# Patient Record
Sex: Female | Born: 1945 | Race: White | Hispanic: No | Marital: Married | Smoking: Never smoker
Health system: Southern US, Community
[De-identification: ages and names within clinical notes are randomized; demographics above are authoritative.]

## PROBLEM LIST (undated history)

## (undated) DIAGNOSIS — F419 Anxiety disorder, unspecified: Secondary | ICD-10-CM

## (undated) DIAGNOSIS — F32A Depression, unspecified: Secondary | ICD-10-CM

## (undated) DIAGNOSIS — K219 Gastro-esophageal reflux disease without esophagitis: Secondary | ICD-10-CM

## (undated) DIAGNOSIS — F329 Major depressive disorder, single episode, unspecified: Secondary | ICD-10-CM

## (undated) DIAGNOSIS — I10 Essential (primary) hypertension: Secondary | ICD-10-CM

## (undated) DIAGNOSIS — E785 Hyperlipidemia, unspecified: Secondary | ICD-10-CM

## (undated) HISTORY — DX: Major depressive disorder, single episode, unspecified: F32.9

## (undated) HISTORY — DX: Gastro-esophageal reflux disease without esophagitis: K21.9

## (undated) HISTORY — PX: OTHER SURGICAL HISTORY: SHX169

## (undated) HISTORY — DX: Essential (primary) hypertension: I10

## (undated) HISTORY — DX: Hyperlipidemia, unspecified: E78.5

## (undated) HISTORY — DX: Depression, unspecified: F32.A

## (undated) HISTORY — DX: Anxiety disorder, unspecified: F41.9

---

## 1998-07-27 ENCOUNTER — Other Ambulatory Visit: Admission: RE | Admit: 1998-07-27 | Discharge: 1998-07-27 | Payer: Self-pay | Admitting: Internal Medicine

## 1999-01-13 HISTORY — PX: COLONOSCOPY: SHX174

## 1999-02-04 ENCOUNTER — Encounter: Payer: Self-pay | Admitting: Internal Medicine

## 2002-02-12 ENCOUNTER — Other Ambulatory Visit: Admission: RE | Admit: 2002-02-12 | Discharge: 2002-02-12 | Payer: Self-pay | Admitting: Internal Medicine

## 2004-09-20 ENCOUNTER — Ambulatory Visit: Payer: Self-pay | Admitting: Internal Medicine

## 2005-01-17 ENCOUNTER — Ambulatory Visit: Payer: Self-pay | Admitting: Internal Medicine

## 2005-07-04 ENCOUNTER — Ambulatory Visit: Payer: Self-pay | Admitting: Internal Medicine

## 2005-07-07 ENCOUNTER — Ambulatory Visit: Payer: Self-pay | Admitting: Internal Medicine

## 2005-07-13 ENCOUNTER — Ambulatory Visit: Payer: Self-pay | Admitting: Internal Medicine

## 2005-07-13 ENCOUNTER — Other Ambulatory Visit: Admission: RE | Admit: 2005-07-13 | Discharge: 2005-07-13 | Payer: Self-pay | Admitting: Internal Medicine

## 2005-07-13 ENCOUNTER — Encounter: Payer: Self-pay | Admitting: Internal Medicine

## 2006-04-24 ENCOUNTER — Ambulatory Visit: Payer: Self-pay | Admitting: Internal Medicine

## 2007-08-29 DIAGNOSIS — E785 Hyperlipidemia, unspecified: Secondary | ICD-10-CM | POA: Insufficient documentation

## 2008-08-19 ENCOUNTER — Ambulatory Visit: Payer: Self-pay | Admitting: Internal Medicine

## 2008-08-19 DIAGNOSIS — I10 Essential (primary) hypertension: Secondary | ICD-10-CM | POA: Insufficient documentation

## 2008-08-19 DIAGNOSIS — R21 Rash and other nonspecific skin eruption: Secondary | ICD-10-CM | POA: Insufficient documentation

## 2008-08-19 HISTORY — DX: Essential (primary) hypertension: I10

## 2008-09-09 ENCOUNTER — Encounter: Payer: Self-pay | Admitting: Internal Medicine

## 2008-11-05 ENCOUNTER — Telehealth: Payer: Self-pay | Admitting: *Deleted

## 2008-12-25 ENCOUNTER — Ambulatory Visit: Payer: Self-pay | Admitting: Internal Medicine

## 2008-12-30 ENCOUNTER — Ambulatory Visit: Payer: Self-pay | Admitting: Internal Medicine

## 2008-12-30 LAB — HM COLONOSCOPY

## 2009-10-02 ENCOUNTER — Ambulatory Visit: Payer: Self-pay | Admitting: Internal Medicine

## 2009-10-02 ENCOUNTER — Telehealth: Payer: Self-pay | Admitting: Internal Medicine

## 2009-10-02 DIAGNOSIS — R1084 Generalized abdominal pain: Secondary | ICD-10-CM | POA: Insufficient documentation

## 2009-10-02 DIAGNOSIS — R110 Nausea: Secondary | ICD-10-CM | POA: Insufficient documentation

## 2009-10-02 DIAGNOSIS — R197 Diarrhea, unspecified: Secondary | ICD-10-CM | POA: Insufficient documentation

## 2009-10-02 DIAGNOSIS — R5383 Other fatigue: Secondary | ICD-10-CM

## 2009-10-02 DIAGNOSIS — R5381 Other malaise: Secondary | ICD-10-CM | POA: Insufficient documentation

## 2009-10-02 DIAGNOSIS — R112 Nausea with vomiting, unspecified: Secondary | ICD-10-CM

## 2009-10-02 HISTORY — DX: Nausea with vomiting, unspecified: R11.2

## 2009-10-02 HISTORY — DX: Generalized abdominal pain: R10.84

## 2009-10-02 LAB — CONVERTED CEMR LAB
AST: 17 units/L (ref 0–37)
Albumin: 3.8 g/dL (ref 3.5–5.2)
BUN: 8 mg/dL (ref 6–23)
Basophils Relative: 0.5 % (ref 0.0–3.0)
Calcium: 9.6 mg/dL (ref 8.4–10.5)
Chloride: 101 meq/L (ref 96–112)
Creatinine, Ser: 0.6 mg/dL (ref 0.4–1.2)
Eosinophils Absolute: 0.3 10*3/uL (ref 0.0–0.7)
Eosinophils Relative: 2.4 % (ref 0.0–5.0)
GFR calc non Af Amer: 107.76 mL/min (ref >60)
Glucose, Bld: 109 mg/dL — ABNORMAL HIGH (ref 70–99)
Hemoglobin: 12.7 g/dL (ref 12.0–15.0)
Lymphocytes Relative: 13.3 % (ref 12.0–46.0)
MCHC: 34.5 g/dL (ref 30.0–36.0)
Monocytes Relative: 7 % (ref 3.0–12.0)
Neutro Abs: 8.5 10*3/uL — ABNORMAL HIGH (ref 1.4–7.7)
Neutrophils Relative %: 76.8 % (ref 43.0–77.0)
Potassium: 4.6 meq/L (ref 3.5–5.1)
RBC: 3.67 M/uL — ABNORMAL LOW (ref 3.87–5.11)
WBC: 11.2 10*3/uL — ABNORMAL HIGH (ref 4.5–10.5)

## 2009-10-05 ENCOUNTER — Telehealth (INDEPENDENT_AMBULATORY_CARE_PROVIDER_SITE_OTHER): Payer: Self-pay | Admitting: *Deleted

## 2009-10-16 ENCOUNTER — Ambulatory Visit: Payer: Self-pay | Admitting: Internal Medicine

## 2009-10-16 DIAGNOSIS — R0982 Postnasal drip: Secondary | ICD-10-CM | POA: Insufficient documentation

## 2009-10-16 DIAGNOSIS — M949 Disorder of cartilage, unspecified: Secondary | ICD-10-CM

## 2009-10-16 DIAGNOSIS — M899 Disorder of bone, unspecified: Secondary | ICD-10-CM | POA: Insufficient documentation

## 2009-10-16 HISTORY — DX: Postnasal drip: R09.82

## 2010-07-16 ENCOUNTER — Ambulatory Visit: Payer: Self-pay | Admitting: Internal Medicine

## 2010-07-16 DIAGNOSIS — R6882 Decreased libido: Secondary | ICD-10-CM | POA: Insufficient documentation

## 2010-07-27 ENCOUNTER — Telehealth: Payer: Self-pay | Admitting: *Deleted

## 2010-07-29 ENCOUNTER — Encounter: Payer: Self-pay | Admitting: Internal Medicine

## 2010-07-29 ENCOUNTER — Telehealth: Payer: Self-pay | Admitting: Internal Medicine

## 2010-08-03 ENCOUNTER — Ambulatory Visit: Payer: Self-pay | Admitting: Internal Medicine

## 2010-08-03 LAB — CONVERTED CEMR LAB
Alkaline Phosphatase: 56 units/L (ref 39–117)
BUN: 14 mg/dL (ref 6–23)
Bilirubin Urine: NEGATIVE
Bilirubin, Direct: 0.1 mg/dL (ref 0.0–0.3)
Blood in Urine, dipstick: NEGATIVE
CO2: 30 meq/L (ref 19–32)
Calcium: 10.4 mg/dL (ref 8.4–10.5)
Chloride: 103 meq/L (ref 96–112)
Cholesterol: 263 mg/dL — ABNORMAL HIGH (ref 0–200)
Creatinine, Ser: 0.8 mg/dL (ref 0.4–1.2)
Direct LDL: 131.2 mg/dL
Eosinophils Absolute: 0.3 10*3/uL (ref 0.0–0.7)
Free T4: 0.77 ng/dL (ref 0.60–1.60)
Glucose, Urine, Semiquant: NEGATIVE
Ketones, urine, test strip: NEGATIVE
Lymphocytes Relative: 40.9 % (ref 12.0–46.0)
MCHC: 34.2 g/dL (ref 30.0–36.0)
MCV: 100.3 fL — ABNORMAL HIGH (ref 78.0–100.0)
Monocytes Absolute: 0.5 10*3/uL (ref 0.1–1.0)
Neutrophils Relative %: 44 % (ref 43.0–77.0)
Platelets: 247 10*3/uL (ref 150.0–400.0)
Protein, U semiquant: NEGATIVE
RBC: 3.84 M/uL — ABNORMAL LOW (ref 3.87–5.11)
T3, Free: 2.6 pg/mL (ref 2.3–4.2)
Total Bilirubin: 0.5 mg/dL (ref 0.3–1.2)
Total CHOL/HDL Ratio: 3
Triglycerides: 197 mg/dL — ABNORMAL HIGH (ref 0.0–149.0)
Urobilinogen, UA: 0.2
VLDL: 39.4 mg/dL (ref 0.0–40.0)
WBC: 5 10*3/uL (ref 4.5–10.5)
pH: 7

## 2010-08-10 ENCOUNTER — Ambulatory Visit: Payer: Self-pay | Admitting: Internal Medicine

## 2010-08-10 ENCOUNTER — Other Ambulatory Visit: Admission: RE | Admit: 2010-08-10 | Discharge: 2010-08-10 | Payer: Self-pay | Admitting: Internal Medicine

## 2010-08-10 DIAGNOSIS — R718 Other abnormality of red blood cells: Secondary | ICD-10-CM | POA: Insufficient documentation

## 2010-08-10 DIAGNOSIS — R350 Frequency of micturition: Secondary | ICD-10-CM

## 2010-08-10 DIAGNOSIS — R9431 Abnormal electrocardiogram [ECG] [EKG]: Secondary | ICD-10-CM | POA: Insufficient documentation

## 2010-08-10 DIAGNOSIS — E875 Hyperkalemia: Secondary | ICD-10-CM | POA: Insufficient documentation

## 2010-08-10 HISTORY — DX: Frequency of micturition: R35.0

## 2010-08-10 HISTORY — DX: Hyperkalemia: E87.5

## 2010-08-10 HISTORY — DX: Abnormal electrocardiogram (ECG) (EKG): R94.31

## 2010-08-10 LAB — CONVERTED CEMR LAB
Bilirubin Urine: NEGATIVE
Glucose, Urine, Semiquant: NEGATIVE
Ketones, urine, test strip: NEGATIVE
Specific Gravity, Urine: 1.01
pH: 7.5

## 2010-08-16 ENCOUNTER — Encounter: Payer: Self-pay | Admitting: Internal Medicine

## 2010-08-16 LAB — CONVERTED CEMR LAB
Calcium: 10.3 mg/dL (ref 8.4–10.5)
Folate: >20 ng/mL
GFR calc non Af Amer: 56.34 mL/min (ref >60)
Glucose, Bld: 90 mg/dL (ref 70–99)
Sodium: 143 meq/L (ref 135–145)
Vitamin B-12: 791 pg/mL (ref 211–911)

## 2010-08-23 LAB — CONVERTED CEMR LAB: Pap Smear: NEGATIVE

## 2010-08-26 ENCOUNTER — Ambulatory Visit: Payer: Self-pay

## 2010-08-26 ENCOUNTER — Encounter: Payer: Self-pay | Admitting: Internal Medicine

## 2010-08-26 ENCOUNTER — Ambulatory Visit (HOSPITAL_COMMUNITY): Admission: RE | Admit: 2010-08-26 | Discharge: 2010-08-26 | Payer: Self-pay | Admitting: Internal Medicine

## 2010-08-26 ENCOUNTER — Ambulatory Visit: Payer: Self-pay | Admitting: Cardiology

## 2010-09-08 ENCOUNTER — Telehealth: Payer: Self-pay | Admitting: *Deleted

## 2010-09-22 ENCOUNTER — Encounter: Payer: Self-pay | Admitting: Internal Medicine

## 2010-12-14 NOTE — Assessment & Plan Note (Signed)
Summary: CPX--PAP//OK PER DR //CCM   Vital Signs:  Patient profile:   65 year old female Menstrual status:  postmenopausal Height:      64.5 inches Weight:      114 pounds BMI:     19.34 Pulse rate:   72 / minute BP sitting:   100 / 66  (right arm) Cuff size:   regular  Vitals Entered By: Romualdo Bolk, CMA (AAMA) (August 10, 2010 1:50 PM)  Serial Vital Signs/Assessments:  Time      Position  BP       Pulse  Resp  Temp     By                     118/80                         Madelin Headings MD  Comments: left arm sitting    wrist cuff 124  By: Madelin Headings MD   CC: CPX with pap   History of Present Illness: Sheri Jensen comes in today  for  preventive visit  and pap  Since last visit  here  there have been no major changes in health status  however she does have  Some increase urgency   for days.  No fever or dysuria  sleep  some better but has intreruptions ? why Has been taking wellbutrin   every day for this past month.   about 10 am.  BP readings are up and down and has brought her machine today.  NO se of meds noted  See last OV .  Preventive Care Screening  Mammogram:    Date:  07/29/2010    Results:  normal   Prior Values:    Mammogram:  normal (06/14/2005)    Colonoscopy:  Location:  [facility].   (12/30/2008)    Bone Density:  done (05/14/2002)    Dexa Interp:  done (05/14/2002)   Preventive Screening-Counseling & Management  Alcohol-Tobacco     Alcohol drinks/day: 2     Alcohol Counseling: to decrease amount and/or frequency of alcohol intake     Smoking Status: never  Caffeine-Diet-Exercise     Caffeine use/day: 0     Does Patient Exercise: yes  Comments: 17  servings per  week of etoh .   Hep-HIV-STD-Contraception     Dental Visit-last 6 months yes     Sun Exposure-Excessive: no  Safety-Violence-Falls     Seat Belt Use: yes     Firearms in the Home: firearms in the home     Firearm Counseling: not  indicated; uses recommended firearm safety measures     Smoke Detectors: yes     Violence in the Home: no risk noted     Sexual Abuse: no     Fall Risk: no       Blood Transfusions:  no.    Current Medications (verified): 1)  Wellbutrin Sr 100 Mg Tb12 (Bupropion Hcl) .... Take One By Mouth Every Day 2)  Alendronate Sodium 70 Mg Tabs (Alendronate Sodium) .Marland Kitchen.. 1 By Mouth Weekly 3)  Calcium-Vitamin D 250-125 Mg-Unit Tabs (Calcium Carbonate-Vitamin D) 4)  Antihistamine Allergy 25 Mg Caps (Diphenhydramine Hcl) .... Take Two Tabs By Mouth Every 4-6 Hours 5)  Womens One Daily  Tabs (Multiple Vitamins-Minerals) .... Once Daily 6)  Lisinopril 10 Mg Tabs (Lisinopril) .Marland Kitchen.. 1 By Mouth Once Daily For High Blood Pressure  Allergies (  verified): No Known Drug Allergies  Past History:  Past medical, surgical, family and social histories (including risk factors) reviewed, and no changes noted (except as noted below).  Past Medical History: Hyperlipidemia Depression Childbirth hypertension  Past Surgical History: Reviewed history from 10/02/2009 and no changes required. CB X2 colonoscopy  03/00   ,2/10-NORMAL  Past History:  Care Management: Gastroenterology:[facility]ie  Family History: Reviewed history from 10/16/2009 and no changes required. Family History of Colon CA 1st degree relative <60--maternal grandfather daughter with crohns disease  in remission Family History of Diabetes: paternal grandfather   Social History: Reviewed history from 10/16/2009 and no changes required. no tobacco   hh of 2  etoh 3 per day Married one son and one daughter exercise Occupation: former Programmer, systems Illicit Drug Use - no   Risk analyst Use:  yes Dental Care w/in 6 mos.:  yes Sun Exposure-Excessive:  no Fall Risk:  no  Blood Transfusions:  no  Review of Systems  The patient denies anorexia, fever, weight loss, chest pain, syncope, dyspnea on exertion, peripheral edema, prolonged cough, difficulty  walking, abnormal bleeding, enlarged lymph nodes, and angioedema.         neg cv pulmonary , Gi effects currently . no change skin  vision or hearing.   Physical Exam  General:  Well-developed,well-nourished,in no acute distress; alert,appropriate and cooperative throughout examination Head:  normocephalic and atraumatic.   Eyes:  PERRL, EOMs full, conjunctiva clear  Ears:  R ear normal, L ear normal, and no external deformities.   Nose:  no external deformity, no external erythema, and no nasal discharge.   Mouth:  good dentition and pharynx pink and moist.   Neck:  No deformities, masses, or tenderness noted. Chest Wall:  No deformities, masses, or tenderness noted. Breasts:  No mass, nodules, thickening, tenderness, bulging, retraction, inflamation, nipple discharge or skin changes noted.   Lungs:  Normal respiratory effort, chest expands symmetrically. Lungs are clear to auscultation, no crackles or wheezes. Heart:  Normal rate and regular rhythm. S1 and S2 normal without gallop, murmur, click, rub or other extra sounds.no lifts.    see bp readings Abdomen:  Bowel sounds positive,abdomen soft and non-tender without masses, organomegaly or hernias noted. Rectal:  No external abnormalities noted. Normal sphincter tone. No rectal masses or tenderness. Genitalia:  Pelvic Exam:        External: normal female genitalia without lesions or masses        Vagina: normal without lesions or masses        Cervix: normal  masses tinu poly tissue at os        Adnexa: normal bimanual exam without masses or fullness        Uterus: normal by palpation        Pap smear: performed Msk:  no joint swelling, no joint warmth, and no redness over joints.   Pulses:  R and L carotid,radial,femoral,dorsalis pedis and posterior tibial pulses are full and equal bilaterally Extremities:  no clubbing cyanosis or edema  Neurologic:  alert & oriented X3, cranial nerves II-XII intact, strength normal in all  extremities, and gait normal.   Skin:  turgor normal, color normal, no ecchymoses, no petechiae, and no purpura.   Cervical Nodes:  No lymphadenopathy noted Axillary Nodes:  No palpable lymphadenopathy Inguinal Nodes:  No significant adenopathy Psych:  Oriented X3, normally interactive, good eye contact, not anxious appearing, and not depressed appearing.     Impression & Recommendations:  Problem # 1:  PREVENTIVE HEALTH CARE (ICD-V70.0) disc healthy lifestyle  Orders: EKG w/ Interpretation (93000)  Problem # 2:  ROUTINE GYNECOLOGICAL EXAM (ICD-V72.31)  pap done   Orders: Pap Smear, Thin Prep ( Collection of) (B3532)  Problem # 3:  HYPERPOTASSEMIA (ICD-276.7) poss venipuncture effect. Orders: TLB-BMP (Basic Metabolic Panel-BMET) (80048-METABOL) Venipuncture (99242) Specimen Handling (68341)  Problem # 4:  OTHER ABNORMALITY OF RED BLOOD CELLS (ICD-790.09) elevated mcv  daily alcohol ?  if reltated  check fot b12  etc  no evidence  other blood diseaeses.  Orders: TLB-B12 + Folate Pnl (96222_97989-Q11/HER) Venipuncture (74081) Specimen Handling (44818)  Problem # 5:  LIBIDO, DECREASED (ICD-799.81) is menopausal but denies  dyspaerunia as cause    . unsure how mood is related .   disc daily alcohol intake but that is not  any change  .    Problem # 6:  OSTEOPENIA (ICD-733.90)  The following medications were removed from the medication list:    Alendronate Sodium 70 Mg Tabs (Alendronate sodium) .Marland Kitchen... 1 by mouth weekly Her updated medication list for this problem includes:    Calcium-vitamin D 250-125 Mg-unit Tabs (Calcium carbonate-vitamin d)  Problem # 7:  HYPERTENSION (ICD-401.9) bp  good today and repeat her own cuff  which is a wrist cuff  discordant at first but  better when  done at heart level.  personally instructed on this by MD today.   Her updated medication list for this problem includes:    Lisinopril 10 Mg Tabs (Lisinopril) .Marland Kitchen... 1 by mouth once daily for  high blood pressure  Problem # 8:  ekg  show prominent p waves but probably wnl  no old ekg to compare.  in EHR  Complete Medication List: 1)  Wellbutrin Sr 100 Mg Tb12 (Bupropion hcl) .... Take one by mouth every day 2)  Calcium-vitamin D 250-125 Mg-unit Tabs (Calcium carbonate-vitamin d) 3)  Antihistamine Allergy 25 Mg Caps (diphenhydramine Hcl)  .... Take two tabs by mouth every 4-6 hours 4)  Womens One Daily Tabs (Multiple vitamins-minerals) .... Once daily 5)  Lisinopril 10 Mg Tabs (Lisinopril) .Marland Kitchen.. 1 by mouth once daily for high blood pressure  Other Orders: UA Dipstick w/o Micro (automated)  (81003) Tdap => 6yrs IM (56314) Admin 1st Vaccine (97026)  Patient Instructions: 1)  take wellbutrin in the am  to not affect sleep.  2)  You will be informed of lab results when available. on repeat potassium  and b12 levels then plan follow up . 3)  would like to increase wellbutrin to  two times a day eventually. 4)  will review your ekg   consider echo test.  5)  You should do a trial off of alcohol for 2 -3 weeks and see  how you feel.   Contraindications/Deferment of Procedures/Staging:    Test/Procedure: FLU VAX    Reason for deferment: patient declined   Laboratory Results   Urine Tests    Routine Urinalysis   Color: yellow Appearance: Clear Glucose: negative   (Normal Range: Negative) Bilirubin: negative   (Normal Range: Negative) Ketone: negative   (Normal Range: Negative) Spec. Gravity: 1.010   (Normal Range: 1.003-1.035) Blood: negative   (Normal Range: Negative) pH: 7.5   (Normal Range: 5.0-8.0) Protein: negative   (Normal Range: Negative) Urobilinogen: 0.2   (Normal Range: 0-1) Nitrite: negative   (Normal Range: Negative) Leukocyte Esterace: negative   (Normal Range: Negative)    Comments: Rita Ohara  August 10, 2010 4:44 PM  Immunizations Administered:  Tetanus Vaccine:    Vaccine Type: Tdap    Site: right deltoid    Mfr:  GlaxoSmithKline    Dose: 0.5 ml    Route: IM    Given by: Romualdo Bolk, CMA (AAMA)    Exp. Date: 09/02/2012    Lot #: [id number]

## 2010-12-14 NOTE — Assessment & Plan Note (Signed)
Summary: MED CK / REFILL // RS   Vital Signs:  Patient profile:   65 year old female Menstrual status:  postmenopausal Height:      65 inches Weight:      114 pounds BMI:     19.04 Pulse rate:   72 / minute BP sitting:   120 / 80  (right arm) Cuff size:   regular  Vitals Entered By: Romualdo Bolk, CMA (AAMA) (July 16, 2010 2:03 PM)  Contraindications/Deferment of Procedures/Staging:    Test/Procedure: FLU VAX    Reason for deferment: patient declined  CC: Follow-up visit  on meds, Hypertension Management     Menstrual Status postmenopausal   History of Present Illness: Sheri Jensen comes in today  for  meds . check and  evaluation her last viist was  in December .  No major changes in health  BP :  Taking med without se  .Marland KitchenMarland KitchenHas  had lifestyle intervention delete salt  in diet .   and bp 120 range.  Mood  evey other daywell butrin.  and missed for 5 days and fatigue has not come back What to do with med  COncern decrease interest in SA   not related to pain.   interfereing with marital relationship .   anger at times  denies  physical pain. prefers no conseling as toakes too long. ? other options Osteopenia.  doesnt like fosamax  to disrupting to take and worry about eophageal problem. Stopped for about a year   Hypertension History:      She denies headache, chest pain, palpitations, dyspnea with exertion, orthopnea, PND, peripheral edema, visual symptoms, neurologic problems, syncope, and side effects from treatment.  She notes no problems with any antihypertensive medication side effects.        Positive major cardiovascular risk factors include female age 50 years old or older, hyperlipidemia, and hypertension.  Negative major cardiovascular risk factors include non-tobacco-user status.     Preventive Screening-Counseling & Management  Alcohol-Tobacco     Alcohol drinks/day: 2     Smoking Status: never  Caffeine-Diet-Exercise     Caffeine use/day: 0  Does Patient Exercise: yes  Current Medications (verified): 1)  Wellbutrin Sr 100 Mg Tb12 (Bupropion Hcl) .... Take One By Mouth Every Other Day 2)  Alendronate Sodium 70 Mg Tabs (Alendronate Sodium) .Marland Kitchen.. 1 By Mouth Weekly 3)  Magnebind 200 450-200 Mg Tabs (Calcium Acetate-Magnesium Carb) .... Take Two Tabs By Mouth Once Daily 4)  Antihistamine Allergy 25 Mg Caps (Diphenhydramine Hcl) .... Take Two Tabs By Mouth Every 4-6 Hours 5)  Nasal Decongestant 10 Mg Tabs (Phenylephrine Hcl) .... Take One By Mouth Every 4 Hours As Needed 6)  Womens One Daily  Tabs (Multiple Vitamins-Minerals) .... Once Daily 7)  Lisinopril 10 Mg Tabs (Lisinopril) .Marland Kitchen.. 1 By Mouth Once Daily For High Blood Pressure  Allergies (verified): No Known Drug Allergies  Past History:  Past medical, surgical, family and social histories (including risk factors) reviewed, and no changes noted (except as noted below).  Past Medical History: Reviewed history from 10/02/2009 and no changes required. Hyperlipidemia Depression  Past Surgical History: Reviewed history from 10/02/2009 and no changes required. CB X2 colonoscopy  03/00   ,2/10-NORMAL  Past History:  Care Management: Gastroenterology:[facility]ie  Family History: Reviewed history from 10/16/2009 and no changes required. Family History of Colon CA 1st degree relative <60--maternal grandfather daughter with crohns disease  in remission Family History of Diabetes: paternal grandfather   Social  History: Reviewed history from 10/16/2009 and no changes required. no tobacco  etoh 3 per day Married one son and one daughter exercise Occupation: former Programmer, systems Illicit Drug Use - no   Caffeine use/day:  0  Review of Systems  The patient denies anorexia, fever, weight loss, weight gain, chest pain, syncope, melena, hematochezia, severe indigestion/heartburn, transient blindness, difficulty walking, abnormal bleeding, enlarged lymph nodes, and angioedema.     Physical Exam  General:  Well-developed,well-nourished,in no acute distress; alert,appropriate and cooperative throughout examinationwell-developed.   Head:  normocephalic and atraumatic.   Eyes:  vision grossly intact.   Neck:  No deformities, masses, or tenderness noted. Lungs:  Normal respiratory effort, chest expands symmetrically. Lungs are clear to auscultation, no crackles or wheezes. Heart:  Normal rate and regular rhythm. S1 and S2 normal without gallop, murmur, click, rub or other extra sounds.no lifts.   Abdomen:  Bowel sounds positive,abdomen soft and non-tender without masses, organomegaly or   noted. Pulses:  nl cap refill Extremities:  no clubbing cyanosis or edema  Neurologic:  grossly nl non focal  Skin:  turgor normal and color normal.   Cervical Nodes:  No lymphadenopathy noted Psych:  Oriented X3, not anxious appearing, and not depressed appearing.  talkative      Impression & Recommendations:  Problem # 1:  HYPERTENSION (ICD-401.9)  Her updated medication list for this problem includes:    Lisinopril 10 Mg Tabs (Lisinopril) .Marland Kitchen... 1 by mouth once daily for high blood pressure  Problem # 2:  LIBIDO, DECREASED (ICD-799.81) unrevealing  by hx   vs mismatch and other factors .    need check up   and labs   and then decide on further eval   Problem # 3:  OSTEOPENIA (ICD-733.90) stopped med  ? se   will revisit in future . Her updated medication list for this problem includes:    Alendronate Sodium 70 Mg Tabs (Alendronate sodium) .Marland Kitchen... 1 by mouth weekly    Magnebind 200 450-200 Mg Tabs (Calcium acetate-magnesium carb) .Marland Kitchen... Take two tabs by mouth once daily  Problem # 4:  Preventive Health Care (ICD-V70.0) over due on mammo and pap pelvic   convinced patient  important to do this and then   Complete Medication List: 1)  Wellbutrin Sr 100 Mg Tb12 (Bupropion hcl) .... Take one by mouth every day 2)  Alendronate Sodium 70 Mg Tabs (Alendronate sodium) .Marland Kitchen.. 1 by  mouth weekly 3)  Magnebind 200 450-200 Mg Tabs (Calcium acetate-magnesium carb) .... Take two tabs by mouth once daily 4)  Antihistamine Allergy 25 Mg Caps (diphenhydramine Hcl)  .... Take two tabs by mouth every 4-6 hours 5)  Womens One Daily Tabs (Multiple vitamins-minerals) .... Once daily 6)  Lisinopril 10 Mg Tabs (Lisinopril) .Marland Kitchen.. 1 by mouth once daily for high blood pressure  Hypertension Assessment/Plan:      The patient's hypertensive risk group is category B: At least one risk factor (excluding diabetes) with no target organ damage.  Today's blood pressure is 120/80.  Her blood pressure goal is < 140/90.  Patient Instructions: 1)  increase aerobic exercise  2)  wellbutrin as we discussed.  every day  3)  need labs and a check up  before further recommendation.  4)  GET a  mammogram.  5)  continue   Lisinopril . 6)  Can revisit the fosamax issue  at a later time . 7)  Fasting lab   CPX with free T4 Free T3  8)  then CPX  ( can  make a slot    not on a thursday). 9)  Bring in your  Blood pressure machine to the   office visit.   Preventive Care Screening  Mammogram:    Date:  06/14/2005    Results:  normal   Prior Values:    Colonoscopy:  Location:  [facility].   (12/30/2008)    Bone Density:  done (05/14/2002)    Dexa Interp:  done (05/14/2002)

## 2010-12-14 NOTE — Progress Notes (Signed)
Summary: refill  Phone Note From Pharmacy   Caller: [facility] [phone]* Reason for Call: Needs renewal Details for Reason: bupropion Initial call taken by: Romualdo Bolk, CMA Duncan Dull),  July 27, 2010 4:45 PM  Follow-up for Phone Call        rx sent to pharmacy Follow-up by: Romualdo Bolk, CMA Duncan Dull),  July 27, 2010 4:46 PM    Prescriptions: WELLBUTRIN SR 100 MG TB12 (BUPROPION HCL) take one by mouth every day  #120 x 0   Entered by:   Romualdo Bolk, CMA (AAMA)   Authorized by:   Madelin Headings MD   Signed by:   Romualdo Bolk, CMA (AAMA) on 07/27/2010   Method used:   Electronically to        [id number] (retail)       [address] [place], [place]  [address]       Ph: [phone]       Fax: [phone]   RxID:   [id number]

## 2010-12-14 NOTE — Progress Notes (Signed)
Summary: results & bone density order  Phone Note Call from Patient Call back at Home Phone [phone]   Caller: vm Summary of Call: Echo results & I need bone density & was told that Dr. Demetrius Charity has to order bone density when I had my mammo done.   Initial call taken by: Rudy Jew, RN,  September 08, 2010 3:46 PM  Follow-up for Phone Call        Please order dexa scan  .    her echo test is normal and no further evalauation at this time. If she is still interested we can  do a  GYNE  referral  to Dr Hyacinth Meeker and Romine  to see if hormonal rx for decreased Libido would be helpful.   Follow-up by: Madelin Headings MD,  September 08, 2010 5:22 PM  Additional Follow-up for Phone Call Additional follow up Details #1::        LMTOCB Additional Follow-up by: Romualdo Bolk, CMA Duncan Dull),  September 08, 2010 5:24 PM    Additional Follow-up for Phone Call Additional follow up Details #2::    Pt aware and wants a rx mailed to her. Follow-up by: Romualdo Bolk, CMA Duncan Dull),  September 09, 2010 9:31 AM  Additional Follow-up for Phone Call Additional follow up Details #3:: Details for Additional Follow-up Action Taken: Mailed rx for dexa to pt. Additional Follow-up by: Romualdo Bolk, CMA Duncan Dull),  September 09, 2010 12:18 PM

## 2010-12-14 NOTE — Progress Notes (Signed)
Summary: Pt says that [facility] did not rcv script. Pls resend  Phone Note Call from Patient Call back at [facility] Phone [phone]   Caller: Patient Summary of Call: Pt called and was told by [facility], that they have not rcvd script for generic Wellbutrin. Pls resend to [facility].     Initial call taken by: Lucy Antigua,  July 29, 2010 1:28 PM    Prescriptions: WELLBUTRIN SR 100 MG TB12 (BUPROPION HCL) take one by mouth every day  #120 x 0   Entered by:   Lynann Beaver CMA   Authorized by:   Madelin Headings MD   Signed by:   Lynann Beaver CMA on 07/29/2010   Method used:   Electronically to        [id number] (retail)       [address] [place], [place]  [address]       Ph: [phone]       Fax: [phone]   RxID:   [id number]  done

## 2011-01-28 ENCOUNTER — Other Ambulatory Visit: Payer: Self-pay | Admitting: Internal Medicine

## 2011-03-29 NOTE — Assessment & Plan Note (Signed)
[facility]                                 ON-CALL NOTE   NAME:Averitt, KAY SHIPPY                       MRN:          [id number]  DATE:10/02/2009                            DOB:          [birth year]    We received Ms. Hopfer' call stating that she is having terrible gas  pains.  She had what she describes as food poising about 5 days ago.  She was seen by our PA, Amy Esterwood, today and was given Bentyl.  She  claims that the Bentyl was not helping the gas pains.  She is both  belching and passing large amounts of flatus.   I called in HyoMax 0.375 mg twice a day to be taken in lieu of Bentyl.     Barbette Hair. Arlyce Dice, MD,FACG  Electronically Signed    RDK/MedQ  DD: 10/02/2009  DT: 10/03/2009  Job #: [id number]   cc:   Hedwig Morton. Juanda Chance, MD

## 2011-05-28 ENCOUNTER — Other Ambulatory Visit: Payer: Self-pay | Admitting: Internal Medicine

## 2011-06-03 ENCOUNTER — Telehealth: Payer: Self-pay | Admitting: Internal Medicine

## 2011-06-03 DIAGNOSIS — R6882 Decreased libido: Secondary | ICD-10-CM

## 2011-06-03 NOTE — Telephone Encounter (Signed)
Pt. Was following up on a referral you had spoke to her about an OBGYN. Please contact

## 2011-06-03 NOTE — Telephone Encounter (Signed)
Spoke to pt and she was calling about the gyn referral from Sept. Order never got placed in emr. I sent the order to [facility]. Pt aware of this.

## 2011-09-06 ENCOUNTER — Encounter: Payer: Self-pay | Admitting: Internal Medicine

## 2011-10-18 ENCOUNTER — Telehealth: Payer: Self-pay | Admitting: *Deleted

## 2011-10-18 MED ORDER — BUPROPION HCL ER (SR) 100 MG PO TB12: 100.0000 mg | ORAL_TABLET | Freq: Every day | ORAL | Status: DC

## 2011-10-18 NOTE — Telephone Encounter (Signed)
Pt needs a refill on BRAND wellbutrin

## 2011-12-08 ENCOUNTER — Other Ambulatory Visit (INDEPENDENT_AMBULATORY_CARE_PROVIDER_SITE_OTHER): Payer: BC Managed Care – PPO

## 2011-12-08 DIAGNOSIS — Z0000 Encounter for general adult medical examination without abnormal findings: Secondary | ICD-10-CM

## 2011-12-08 LAB — CBC WITH DIFFERENTIAL/PLATELET
Basophils Absolute: 0 10*3/uL (ref 0.0–0.1)
Eosinophils Absolute: 0.4 10*3/uL (ref 0.0–0.7)
HCT: 37.3 % (ref 36.0–46.0)
Hemoglobin: 12.5 g/dL (ref 12.0–15.0)
Lymphs Abs: 1.5 10*3/uL (ref 0.7–4.0)
MCHC: 33.5 g/dL (ref 30.0–36.0)
Neutro Abs: 2.5 10*3/uL (ref 1.4–7.7)
RDW: 13.8 % (ref 11.5–14.6)

## 2011-12-08 LAB — POCT URINALYSIS DIPSTICK
Glucose, UA: NEGATIVE
Nitrite, UA: NEGATIVE
Urobilinogen, UA: 0.2

## 2011-12-08 LAB — HEPATIC FUNCTION PANEL
AST: 18 U/L (ref 0–37)
Albumin: 4.3 g/dL (ref 3.5–5.2)
Alkaline Phosphatase: 55 U/L (ref 39–117)
Total Bilirubin: 0.8 mg/dL (ref 0.3–1.2)

## 2011-12-08 LAB — TSH: TSH: 1.9 u[IU]/mL (ref 0.35–5.50)

## 2011-12-08 LAB — BASIC METABOLIC PANEL
CO2: 30 mEq/L (ref 19–32)
Glucose, Bld: 100 mg/dL — ABNORMAL HIGH (ref 70–99)
Potassium: 5.2 mEq/L — ABNORMAL HIGH (ref 3.5–5.1)
Sodium: 141 mEq/L (ref 135–145)

## 2011-12-08 LAB — LIPID PANEL
Total CHOL/HDL Ratio: 3
VLDL: 17.8 mg/dL (ref 0.0–40.0)

## 2011-12-08 LAB — LDL CHOLESTEROL, DIRECT: Direct LDL: 122.4 mg/dL

## 2011-12-27 ENCOUNTER — Encounter: Payer: Self-pay | Admitting: Internal Medicine

## 2012-01-16 ENCOUNTER — Telehealth: Payer: Self-pay | Admitting: Internal Medicine

## 2012-01-16 MED ORDER — BUPROPION HCL ER (SR) 100 MG PO TB12: 100.0000 mg | ORAL_TABLET | Freq: Every day | ORAL | Status: DC

## 2012-01-16 NOTE — Telephone Encounter (Signed)
Pt called and said that they need a new script for buPROPion [facility] SR) 100 MG 12 hr tablet to [facility] [phone]. Pt req that this needs to be called in today. Pt has been without meds for 6 days.

## 2012-01-16 NOTE — Telephone Encounter (Signed)
RX SENT TO PHARMACY

## 2012-01-24 ENCOUNTER — Encounter: Payer: Self-pay | Admitting: Internal Medicine

## 2012-01-24 ENCOUNTER — Ambulatory Visit (INDEPENDENT_AMBULATORY_CARE_PROVIDER_SITE_OTHER): Payer: BC Managed Care – PPO | Admitting: Internal Medicine

## 2012-01-24 VITALS — BP 98/60 | HR 92 | Ht 62.5 in | Wt 107.0 lb

## 2012-01-24 DIAGNOSIS — G478 Other sleep disorders: Secondary | ICD-10-CM

## 2012-01-24 DIAGNOSIS — I10 Essential (primary) hypertension: Secondary | ICD-10-CM

## 2012-01-24 DIAGNOSIS — K5900 Constipation, unspecified: Secondary | ICD-10-CM

## 2012-01-24 DIAGNOSIS — G479 Sleep disorder, unspecified: Secondary | ICD-10-CM

## 2012-01-24 DIAGNOSIS — M19049 Primary osteoarthritis, unspecified hand: Secondary | ICD-10-CM

## 2012-01-24 DIAGNOSIS — R718 Other abnormality of red blood cells: Secondary | ICD-10-CM

## 2012-01-24 DIAGNOSIS — Z0000 Encounter for general adult medical examination without abnormal findings: Secondary | ICD-10-CM

## 2012-01-24 HISTORY — DX: Sleep disorder, unspecified: G47.9

## 2012-01-24 HISTORY — DX: Primary osteoarthritis, unspecified hand: M19.049

## 2012-01-24 NOTE — Progress Notes (Signed)
Subjective:    Patient ID: Sheri Jensen, female    DOB: 01-30-1946, 66 y.o.   MRN: [id number]  HPI Patient comes in today for preventive visit and follow-up of medical issues. Update  history since  last visit:  Sleep  Always an issue  Usually wakening  2 etoh per night   Some tea at night avoids caffeine    Constipated. "All the time  " 2 x per week month and eating prunes . Considering  miralax  Prefers no chemical rx .  utd on colon 201o on 10 year recall   ?Mood : If try not taking the wellbutrin.  Numbness?  Left side and foot.  When shaving legs.  On phone and stiing  Lateral. Positional   No neck pain . Laying in bed sleep less noted it.   Onset for a month.  No whole extremity weakness or numbness  Exercise no problems   Weight training and aerobic and bands .   Has hand pain and swelling but no sig pain now  Has uncle with RA asks about this left more than right.   BP not checking no se of med  Review of Systems ROS:  GEN/ HEENT: No fever, significant weight changes sweats headaches vision problems hearing changes, CV/ PULM; No chest pain shortness of breath cough, syncope,edema  change in exercise tolerance. GI /GU: No adominal pain, vomiting, change in bowel habits. No blood in the stool. No significant GU symptoms. SKIN/HEME: ,no acute skin rashes suspicious lesions or bleeding. No lymphadenopathy, nodules, masses.  NEURO/ PSYCH:  No neurologic signs such as weakness numbness. No depression anxiety. IMM/ Allergy: No unusual infections.  Allergy .   REST of 12 system review negative except as per HPI Past history family history social history reviewed in the electronic medical record. Daughter had healthy baby 3 weeks ago.    Objective:   Physical Exam Physical Exam: Vital signs reviewed WUJ:WJXB is a well-developed well-nourished alert cooperative  white female who appears her stated age in no acute distress.  HEENT: normocephalic atraumatic , Eyes: PERRL  EOM's full, conjunctiva clear, Nares: paten,t no deformity discharge or tenderness., Ears: no deformity EAC's clear TMs with normal landmarks. Mouth: clear OP, no lesions, edema.  Moist mucous membranes. Dentition in adequate repair. NECK: supple without masses, thyromegaly or bruits. CHEST/PULM:  Clear to auscultation and percussion breath sounds equal no wheeze , rales or rhonchi. No chest wall deformities or tenderness. CV: PMI is nondisplaced, S1 S2 no gallops, murmurs, rubs. Peripheral pulses are full without delay.No JVD .  Breast: normal by inspection . No dimpling, discharge, masses, tenderness or discharge .  ABDOMEN: Bowel sounds normal nontender  No guard or rebound, no hepato splenomegal no CVA tenderness.  No hernia. Extremtities:  No clubbing cyanosis or edema, no acute joint swelling or redness no focal atrophy NEURO:  Oriented x3, cranial nerves 3-12 appear to be intact, no obvious focal weakness,gait within normal limits no abnormal reflexes or asymmetrical SKIN: No acute rashes normal turgor, color, no bruising or petechiae. PSYCH: Oriented, good eye contact, no obvious depression anxiety, cognition and judgment appear normal. LN: no cervical axillary inguinal adenopathy Had pap and pelvic last year no sx    Lab Results  Component Value Date   WBC 4.9 12/08/2011   HGB 12.5 12/08/2011   HCT 37.3 12/08/2011   PLT 235.0 12/08/2011   GLUCOSE 100* 12/08/2011   CHOL 234* 12/08/2011   TRIG 89.0 12/08/2011   HDL  87.10 12/08/2011   LDLDIRECT 122.4 12/08/2011   ALT 13 12/08/2011   AST 18 12/08/2011   NA 141 12/08/2011   K 5.2* 12/08/2011   CL 106 12/08/2011   CREATININE 0.9 12/08/2011   BUN 17 12/08/2011   CO2 30 12/08/2011   TSH 1.90 12/08/2011          Assessment & Plan:  Preventive Health Care Continue lifestyle intervention healthy eating and exercise . Check in to zostavax reimbursement  Mood : Ok to try  Stopping  wellbutrin   Constipation   Consider other methods and try  off wellbutrin Sleep always problematic   Disc sleep hygiene and avoid late night etoh. Disc sleep aids and dependency and other se risk   For now lsi and call if want to try small amount of sleep aid to use prn  Limited.  HT  Good today can try decreasing does   Potassium may be blood draw effect  Will folllow Hand arthritis not that sx but does have MTP changes left

## 2012-01-24 NOTE — Patient Instructions (Addendum)
Do stool cards to check for blood colon screening  Decrease  the Wellbutrin to see if this improves the constipation and still consider MiraLax.  Implement intensifies sleep hygiene decrease backbleeding before bed , alcohol close to bedtime. Continue your healthy exercise.  Call us if you want to try a sleep aid realizing that they are dependent producing and have withdrawal symptoms and potentially cognitive side effects.  Because her blood pressure is so good recommend decrease the lsinopril to 5 mg a day or half a pill if blood pressure readings are 120 or below you can try off of the medication and monitor blood pressure the next 2 months.  Contact us if  get swelling of the joints and morning stiffness it is getting worse otherwise we can get it x-rayed her hands in the meantime..  Contact us if you have any persistence or progression of the  numbness that you discussed.   Insomnia Insomnia is frequent trouble falling and/or staying asleep. Insomnia can be a long term problem or a short term problem. Both are common. Insomnia can be a short term problem when the wakefulness is related to a certain stress or worry. Long term insomnia is often related to ongoing stress during waking hours and/or poor sleeping habits. Overtime, sleep deprivation itself can make the problem worse. Every little thing feels more severe because you are overtired and your ability to cope is decreased. CAUSES   Stress, anxiety, and depression.   Poor sleeping habits.   Distractions such as TV in the bedroom.   Naps close to bedtime.   Engaging in emotionally charged conversations before bed.   Technical reading before sleep.   Alcohol and other sedatives. They may make the problem worse. They can hurt normal sleep patterns and normal dream activity.   Stimulants such as caffeine for several hours prior to bedtime.   Pain syndromes and shortness of breath can cause insomnia.   Exercise late at night.     Changing time zones may cause sleeping problems (jet lag).  It is sometimes helpful to have someone observe your sleeping patterns. They should look for periods of not breathing during the night (sleep apnea). They should also look to see how long those periods last. If you live alone or observers are uncertain, you can also be observed at a sleep clinic where your sleep patterns will be professionally monitored. Sleep apnea requires a checkup and treatment. Give your caregivers your medical history. Give your caregivers observations your family has made about your sleep.  SYMPTOMS   Not feeling rested in the morning.   Anxiety and restlessness at bedtime.   Difficulty falling and staying asleep.  TREATMENT   Your caregiver may prescribe treatment for an underlying medical disorders. Your caregiver can give advice or help if you are using alcohol or other drugs for self-medication. Treatment of underlying problems will usually eliminate insomnia problems.   Medications can be prescribed for short time use. They are generally not recommended for lengthy use.   Over-the-counter sleep medicines are not recommended for lengthy use. They can be habit forming.   You can promote easier sleeping by making lifestyle changes such as:   Using relaxation techniques that help with breathing and reduce muscle tension.   Exercising earlier in the day.   Changing your diet and the time of your last meal. No night time snacks.   Establish a regular time to go to bed.   Counseling can help with stressful problems  and worry.   Soothing music and white noise may be helpful if there are background noises you cannot remove.   Stop tedious detailed work at least one hour before bedtime.  HOME CARE INSTRUCTIONS   Keep a diary. Inform your caregiver about your progress. This includes any medication side effects. See your caregiver regularly. Take note of:   Times when you are asleep.   Times when  you are awake during the night.   The quality of your sleep.   How you feel the next day.  This information will help your caregiver care for you.  Get out of bed if you are still awake after 15 minutes. Read or do some quiet activity. Keep the lights down. Wait until you feel sleepy and go back to bed.   Keep regular sleeping and waking hours. Avoid naps.   Exercise regularly.   Avoid distractions at bedtime. Distractions include watching television or engaging in any intense or detailed activity like attempting to balance the household checkbook.   Develop a bedtime ritual. Keep a familiar routine of bathing, brushing your teeth, climbing into bed at the same time each night, listening to soothing music. Routines increase the success of falling to sleep faster.   Use relaxation techniques. This can be using breathing and muscle tension release routines. It can also include visualizing peaceful scenes. You can also help control troubling or intruding thoughts by keeping your mind occupied with boring or repetitive thoughts like the old concept of counting sheep. You can make it more creative like imagining planting one beautiful flower after another in your backyard garden.   During your day, work to eliminate stress. When this is not possible use some of the previous suggestions to help reduce the anxiety that accompanies stressful situations.  MAKE SURE YOU:   Understand these instructions.   Will watch your condition.   Will get help right away if you are not doing well or get worse.  Document Released: 10/28/2000 Document Revised: 10/20/2011 Document Reviewed: 11/28/2007 [facility] Patient Information 2012 [place], [place].

## 2012-01-31 ENCOUNTER — Telehealth: Payer: Self-pay | Admitting: Family Medicine

## 2012-01-31 NOTE — Telephone Encounter (Signed)
[facility] wanted you to know that this patient never came in to get the films you ordered on 3/12. 

## 2012-01-31 NOTE — Telephone Encounter (Signed)
Left message on machine to call back  

## 2012-02-01 NOTE — Telephone Encounter (Signed)
Spoke to pt and she still is planning getting the x-rays.

## 2012-02-07 ENCOUNTER — Ambulatory Visit (INDEPENDENT_AMBULATORY_CARE_PROVIDER_SITE_OTHER)
Admission: RE | Admit: 2012-02-07 | Discharge: 2012-02-07 | Disposition: A | Discharge status: Home / Self Care | Payer: BC Managed Care – PPO | Source: Ambulatory Visit | Attending: Internal Medicine | Admitting: Internal Medicine

## 2012-02-07 DIAGNOSIS — Z0000 Encounter for general adult medical examination without abnormal findings: Secondary | ICD-10-CM

## 2012-02-07 DIAGNOSIS — M19049 Primary osteoarthritis, unspecified hand: Secondary | ICD-10-CM

## 2012-02-08 NOTE — Progress Notes (Signed)
Quick Note:  Left message to call back. ______ 

## 2012-02-09 NOTE — Progress Notes (Signed)
Quick Note:  Pt aware ______ 

## 2012-04-05 ENCOUNTER — Other Ambulatory Visit (INDEPENDENT_AMBULATORY_CARE_PROVIDER_SITE_OTHER): Payer: BC Managed Care – PPO

## 2012-04-05 DIAGNOSIS — R197 Diarrhea, unspecified: Secondary | ICD-10-CM

## 2012-04-05 LAB — HEMOCCULT GUIAC POC 1CARD (OFFICE): Card #3 Fecal Occult Blood, POC: NEGATIVE

## 2012-09-28 ENCOUNTER — Ambulatory Visit (INDEPENDENT_AMBULATORY_CARE_PROVIDER_SITE_OTHER): Payer: BC Managed Care – PPO | Admitting: Family Medicine

## 2012-09-28 DIAGNOSIS — Z23 Encounter for immunization: Secondary | ICD-10-CM

## 2012-11-02 ENCOUNTER — Other Ambulatory Visit: Payer: Self-pay | Admitting: Family Medicine

## 2012-11-13 ENCOUNTER — Other Ambulatory Visit: Payer: Self-pay | Admitting: Internal Medicine

## 2012-11-13 MED ORDER — BUPROPION HCL ER (SR) 100 MG PO TB12: 100.0000 mg | ORAL_TABLET | Freq: Every day | ORAL | Status: DC

## 2012-11-13 NOTE — Telephone Encounter (Signed)
Sent to the pharmacy by e-scribe. 

## 2012-11-13 NOTE — Telephone Encounter (Signed)
Pt would like generic wellbutrin  100mg  call into   [facility]. Pt has one pill left

## 2012-11-30 LAB — HM MAMMOGRAPHY: HM Mammogram: ABNORMAL

## 2012-12-21 ENCOUNTER — Encounter: Payer: Self-pay | Admitting: Internal Medicine

## 2013-01-03 ENCOUNTER — Telehealth: Payer: Self-pay | Admitting: Family Medicine

## 2013-01-03 NOTE — Telephone Encounter (Signed)
I have not contacted the pt.  She will need a CPE.  Please call the pt and scheduled the appt with WP.  Thanks!!

## 2013-01-03 NOTE — Telephone Encounter (Signed)
lmom/kjh 

## 2013-01-07 ENCOUNTER — Telehealth: Payer: Self-pay | Admitting: Family Medicine

## 2013-01-07 NOTE — Telephone Encounter (Signed)
Dr. Fabian Sharp would like to see this patient in the office.  Received her bone density report.  I have not contacted this patient.  Please call her and make appointment and let me know when she is coming. Thanks!!

## 2013-01-07 NOTE — Telephone Encounter (Signed)
lmom for pt to call back

## 2013-01-08 NOTE — Telephone Encounter (Signed)
Pt has been sch for 3-4

## 2013-01-09 NOTE — Telephone Encounter (Signed)
01/15/13 

## 2013-01-15 ENCOUNTER — Encounter: Payer: Self-pay | Admitting: Internal Medicine

## 2013-01-15 ENCOUNTER — Ambulatory Visit (INDEPENDENT_AMBULATORY_CARE_PROVIDER_SITE_OTHER): Payer: Medicare Other | Admitting: Internal Medicine

## 2013-01-15 VITALS — BP 144/72 | HR 78 | Temp 97.9°F | Wt 110.0 lb

## 2013-01-15 DIAGNOSIS — Z8262 Family history of osteoporosis: Secondary | ICD-10-CM | POA: Insufficient documentation

## 2013-01-15 DIAGNOSIS — M899 Disorder of bone, unspecified: Secondary | ICD-10-CM

## 2013-01-15 DIAGNOSIS — K9049 Malabsorption due to intolerance, not elsewhere classified: Secondary | ICD-10-CM

## 2013-01-15 DIAGNOSIS — M8580 Other specified disorders of bone density and structure, unspecified site: Secondary | ICD-10-CM

## 2013-01-15 DIAGNOSIS — K9089 Other intestinal malabsorption: Secondary | ICD-10-CM

## 2013-01-15 DIAGNOSIS — G479 Sleep disorder, unspecified: Secondary | ICD-10-CM

## 2013-01-15 HISTORY — DX: Malabsorption due to intolerance, not elsewhere classified: K90.49

## 2013-01-15 HISTORY — DX: Other specified disorders of bone density and structure, unspecified site: M85.80

## 2013-01-15 NOTE — Progress Notes (Signed)
Chief Complaint  Patient presents with  . Follow-up    Bone density    HPI: Patient comes in today for followup of her bone density results that were done in January. She has a family history of osteoporosis and has a history of having been on Fosamax for about 5 years or so and has been off of the medication for at least 5 years. She has no history of fracture. Over the last 9 months she has stopped her calcium vitamin D supplements because of constipation problems she is also decreased her dairy because of stomach upset although she likes dairy foods.   She takes Wellbutrin but cuts it in quarters and takes it once in the morning and it significantly helps her sleep and mood. She denies any side effects from cutting the SR 12 medication. She also takes some magnesium and night to help with sleep sometimes causing loose stools. She states she sleeps better with this.  She feels her mood is better on the medication. Last ov was almost a year ago . For her wellness visit and she had labs done at that time which were good.  She hasn't been exercising as much recently because of the holidays she helps take care of her 22-year-old grandchild which she loves doing. Art sweetener.   Caused  Sleep  difficulties and she slept better when she discontinued dose. Taking 1/4 week              ROS: See pertinent positives and negatives per HPI.  Past Medical History  Diagnosis Date  . Hyperlipidemia   . Hypertension   . Depression     Family History  Problem Relation Age of Onset  . Crohn's disease Daughter   . Cancer Maternal Grandfather     colon  . Diabetes Paternal Grandfather     History   Social History  . Marital Status: Married    Spouse Name: N/A    Number of Children: N/A  . Years of Education: N/A   Social History Main Topics  . Smoking status: Never Smoker   . Smokeless tobacco: Never Used  . Alcohol Use: Yes     Comment: 2 glasses of wine daily   . Drug Use: No  .  Sexually Active: None   Other Topics Concern  . None   Social History Narrative   No tobacco    hh of 2   A cat    etoh 2 per day    With meals    Married one son and one daughter   Exercise well.    Occupation:  Former Careers information officer Prescriptions as of 01/15/2013  Medication Sig Dispense Refill  . buPROPion (WELLBUTRIN SR) 100 MG 12 hr tablet Take 1 tablet (100 mg total) by mouth daily.  30 tablet  2  . calcium-vitamin D (OSCAL) 250-125 MG-UNIT per tablet Take 1 tablet by mouth daily.      . diphenhydrAMINE (BENADRYL) 25 mg capsule Take 25 mg by mouth every 6 (six) hours as needed.      . Multiple Vitamins-Minerals (WOMENS ONE DAILY PO) Take by mouth daily.      . [DISCONTINUED] lisinopril (PRINIVIL,ZESTRIL) 10 MG tablet Take 0.5 tablets (5 mg total) by mouth daily.       No facility-administered encounter medications on file as of 01/15/2013.    EXAM:  BP 144/72  Pulse 78  Temp(Src) 97.9 F (36.6 C) (Oral)  Wt 110 lb (49.896  kg)  BMI 19.79 kg/m2  SpO2 99%  Body mass index is 19.79 kg/(m^2). Wt Readings from Last 3 Encounters:  01/15/13 110 lb (49.896 kg)  01/24/12 107 lb (48.535 kg)  08/10/10 114 lb (51.71 kg)     GENERAL: vitals reviewed and listed above, alert, oriented, appears well hydrated and in no acute distress slender well-groomed  HEENT: Grossly normal  PSYCH: pleasant and cooperative, no obvious depression or anxiety DEXA scan reviewed date 11/30/2012 left femoral neck -1.6 statistically significant decrease in BMD. In lumbar spine and left hip. Advised to repeat in 2-3 years. ASSESSMENT AND PLAN:  Discussed the following assessment and plan:  Osteopenia - History of Fosamax use in the past. - 1.6 hip repeat in 2-3 years.  Family history of osteoporosis  Milk intolerance  Sleep disturbance, unspecified - Much improved on low dose ( 1/4) Wellbutrin and magnesium. Avoidance of artificial sweeteners.No se of split dose of  wellbutrin Tends to get GI side effects and constipation with supplements and has some problems with milk intolerance although likes dairy products.  Ok to  add miralax as needed but prefer not to use  Continuously unless absolutely needed. Long discussion about ways around this to get reasonably adequate calcium vitamin D and she certainly can restart her resistance training and weightbearing exercise. At this time I don't think more intervention is needed. Discussed effect of Fosamax for bone density readings. Would repeat this in 2-3 years for reasons discussed.  When we do her wellness visit and laboratory studies we will add a vitamin D level at that time.  -Patient advised to return or notify health care team  if symptoms worsen or persist or new concerns arise.  Patient Instructions  Consider trying supplement  calcium and  Magnesium together  .   Try  once a day and see how tolerated. See if you can get in  At least 400 - 600 mg equivalent  Of calcium .  Also  can try other formulation of  calcium such as the chew.   Weight bearing exercise is important.     And also   Weights   And resistance training  .   Your bone density has  Gotten  a bit worse  But is not in the dangerous range at this point.  The fosamax may have helped this  .  I would advise repeat the dexa scan in about 2 -3 years to make sure not dropping too  rapidly.   Plan preventive  .  Visit in the summer  And will do labs at that visit to include  A vit d level .   [name]. Panosh M.D.

## 2013-01-15 NOTE — Patient Instructions (Addendum)
Consider trying supplement  calcium and  Magnesium together  .   Try  once a day and see how tolerated. See if you can get in  At least 400 - 600 mg equivalent  Of calcium .  Also  can try other formulation of  calcium such as the chew.   Weight bearing exercise is important.     And also   Weights   And resistance training  .   Your bone density has  Gotten  a bit worse  But is not in the dangerous range at this point.  The fosamax may have helped this  .  I would advise repeat the dexa scan in about 2 -3 years to make sure not dropping too  rapidly.   Plan preventive  .  Visit in the summer  And will do labs at that visit to include  A vit d level .

## 2013-02-01 ENCOUNTER — Encounter: Payer: Self-pay | Admitting: Internal Medicine

## 2013-04-13 ENCOUNTER — Encounter: Payer: Self-pay | Admitting: Family Medicine

## 2013-04-13 ENCOUNTER — Ambulatory Visit (INDEPENDENT_AMBULATORY_CARE_PROVIDER_SITE_OTHER): Payer: Medicare Other | Admitting: Family Medicine

## 2013-04-13 VITALS — BP 120/70 | Temp 97.1°F | Wt 104.0 lb

## 2013-04-13 DIAGNOSIS — IMO0002 Reserved for concepts with insufficient information to code with codable children: Secondary | ICD-10-CM

## 2013-04-13 DIAGNOSIS — T148XXA Other injury of unspecified body region, initial encounter: Secondary | ICD-10-CM

## 2013-04-13 MED ORDER — ERYTHROMYCIN 2 % EX OINT: TOPICAL_OINTMENT | CUTANEOUS | Status: DC

## 2013-04-13 NOTE — Progress Notes (Signed)
Chief Complaint  Patient presents with  . abrasion above left eye    HPI:  Acute visit for cut on eyelid: -small cut on eyelid occurred about 1 week or so ago -a little irritated -no fevers, chills, vision changes, swelling or drainage  ROS: See pertinent positives and negatives per HPI.  Past Medical History  Diagnosis Date  . Hyperlipidemia   . Hypertension   . Depression     Family History  Problem Relation Age of Onset  . Crohn's disease Daughter   . Cancer Maternal Grandfather     colon  . Diabetes Paternal Grandfather     History   Social History  . Marital Status: Married    Spouse Name: N/A    Number of Children: N/A  . Years of Education: N/A   Social History Main Topics  . Smoking status: Never Smoker   . Smokeless tobacco: Never Used  . Alcohol Use: Yes     Comment: 2 glasses of wine daily   . Drug Use: No  . Sexually Active: None   Other Topics Concern  . None   Social History Narrative   No tobacco    hh of 2   A cat    etoh 2 per day    With meals    Married one son and one daughter   Exercise well.    Occupation:  Former Programmer, systems    Current outpatient prescriptions:buPROPion (WELLBUTRIN SR) 100 MG 12 hr tablet, Take 1 tablet (100 mg total) by mouth daily., Disp: 30 tablet, Rfl: 2;  calcium-vitamin D (OSCAL) 250-125 MG-UNIT per tablet, Take 1 tablet by mouth daily., Disp: , Rfl: ;  Multiple Vitamins-Minerals (WOMENS ONE DAILY PO), Take by mouth daily., Disp: , Rfl: ;  diphenhydrAMINE (BENADRYL) 25 mg capsule, Take 25 mg by mouth every 6 (six) hours as needed., Disp: , Rfl:  Erythromycin 2 % ointment, Apply very small amount twice daily for 1 week, Disp: 25 g, Rfl: 0  EXAM:  Filed Vitals:   04/13/13 0953  BP: 120/70  Temp: 97.1 F (36.2 C)    Body mass index is 18.71 kg/(m^2).  GENERAL: vitals reviewed and listed above, alert, oriented, appears well hydrated and in no acute distress  HEENT: atraumatic, conjunttiva clear, no obvious  abnormalities on inspection of external nose and ears  NECK: no obvious masses on inspection  SKIN: very tiny superficial lac L eyelid, minimal crusting, no swelling or surround induration  MS: moves all extremities without noticeable abnormality  PSYCH: pleasant and cooperative, no obvious depression or anxiety  ASSESSMENT AND PLAN:  Discussed the following assessment and plan:  Laceration - Plan: Erythromycin 2 % ointment  -no make up, creams, washes or ointments other the rx. See PCP if not healing or if worsening. -Patient advised to return or notify a doctor immediately if symptoms worsen or persist or new concerns arise.  There are no Patient Instructions on file for this visit.   Kriste Basque R.

## 2013-06-11 ENCOUNTER — Encounter: Payer: Self-pay | Admitting: Internal Medicine

## 2013-06-11 ENCOUNTER — Other Ambulatory Visit (HOSPITAL_COMMUNITY)
Admission: RE | Admit: 2013-06-11 | Discharge: 2013-06-11 | Disposition: A | Discharge status: Home / Self Care | Payer: Medicare Other | Source: Ambulatory Visit | Attending: Internal Medicine | Admitting: Internal Medicine

## 2013-06-11 ENCOUNTER — Ambulatory Visit (INDEPENDENT_AMBULATORY_CARE_PROVIDER_SITE_OTHER): Payer: Medicare Other | Admitting: Internal Medicine

## 2013-06-11 VITALS — BP 116/80 | HR 63 | Temp 98.2°F | Ht 64.5 in | Wt 105.0 lb

## 2013-06-11 DIAGNOSIS — E785 Hyperlipidemia, unspecified: Secondary | ICD-10-CM

## 2013-06-11 DIAGNOSIS — M899 Disorder of bone, unspecified: Secondary | ICD-10-CM

## 2013-06-11 DIAGNOSIS — L989 Disorder of the skin and subcutaneous tissue, unspecified: Secondary | ICD-10-CM

## 2013-06-11 DIAGNOSIS — Z0000 Encounter for general adult medical examination without abnormal findings: Secondary | ICD-10-CM | POA: Insufficient documentation

## 2013-06-11 DIAGNOSIS — Z0110 Encounter for examination of ears and hearing without abnormal findings: Secondary | ICD-10-CM

## 2013-06-11 DIAGNOSIS — Z124 Encounter for screening for malignant neoplasm of cervix: Secondary | ICD-10-CM | POA: Insufficient documentation

## 2013-06-11 DIAGNOSIS — M8580 Other specified disorders of bone density and structure, unspecified site: Secondary | ICD-10-CM

## 2013-06-11 DIAGNOSIS — Z01419 Encounter for gynecological examination (general) (routine) without abnormal findings: Secondary | ICD-10-CM | POA: Insufficient documentation

## 2013-06-11 DIAGNOSIS — Z23 Encounter for immunization: Secondary | ICD-10-CM

## 2013-06-11 LAB — CBC WITH DIFFERENTIAL/PLATELET
Eosinophils Relative: 5.3 % — ABNORMAL HIGH (ref 0.0–5.0)
HCT: 39 % (ref 36.0–46.0)
Hemoglobin: 13 g/dL (ref 12.0–15.0)
Lymphs Abs: 1.6 10*3/uL (ref 0.7–4.0)
Monocytes Relative: 8.7 % (ref 3.0–12.0)
Neutro Abs: 2.9 10*3/uL (ref 1.4–7.7)
RBC: 3.94 Mil/uL (ref 3.87–5.11)
WBC: 5.3 10*3/uL (ref 4.5–10.5)

## 2013-06-11 LAB — HEPATIC FUNCTION PANEL
ALT: 17 U/L (ref 0–35)
AST: 21 U/L (ref 0–37)
Albumin: 4.4 g/dL (ref 3.5–5.2)
Alkaline Phosphatase: 52 U/L (ref 39–117)
Total Bilirubin: 1 mg/dL (ref 0.3–1.2)

## 2013-06-11 LAB — LIPID PANEL
Cholesterol: 236 mg/dL — ABNORMAL HIGH (ref 0–200)
Total CHOL/HDL Ratio: 2

## 2013-06-11 LAB — LDL CHOLESTEROL, DIRECT: Direct LDL: 109.9 mg/dL

## 2013-06-11 LAB — BASIC METABOLIC PANEL
GFR: 95.41 mL/min (ref >60.00)
Potassium: 4.1 mEq/L (ref 3.5–5.1)
Sodium: 138 mEq/L (ref 135–145)

## 2013-06-11 NOTE — Progress Notes (Signed)
Chief Complaint  Patient presents with  . Medicare Wellness    welcome to medicare    HPI: Patient comes in today for Preventive Medicare wellness visit . No major injuries, ed visits ,hospitalizations , new medications since last visit.  No injuries she is now off the Wellbutrin and sleeping better. Check one scaly area on the right back also was treated for eye rash and irritation with erythromycin ointment which helps but still has a flaky area on the right upper lid. She is taking calcium and vitamin D just once a day but it hasn't really helped her constipation asks if she should go back up. Has remote history of Fosamax no history of fracture last bone density in the fall 2013 DEXA -1.6   Hearing:  Ok   Vision:  No limitations at present . Last eye check UTD heavy reader .  No recent eye check.  Safety:  Has smoke detector and wears seat belts.  No firearms. No excess sun exposure. Sees dentist regularly.  Falls:  No falling   Advance directive :  Reviewed  Has one.  Memory: Felt to be good  , no concern from her or her family.  Not great but ?   Depression: No anhedonia unusual crying or depressive symptoms  Nutrition: Eats well balanced diet; adequate calcium and vitamin D. No swallowing chewing problems.  Injury: no major injuries in the last six months.  Other healthcare providers:  Reviewed today .  Social:  Lives with spouse married. 2  . Pet cat   Preventive parameters: up-to-date  Reviewed   ADLS:   There are no problems or need for assistance  driving, feeding, obtaining food, dressing, toileting and bathing, managing money using phone. She is independent.  EXERCISE/ HABITS  Per week  3 days - 4 days   Aerobic and strength  Training No tobacco    etoh sleep better  But better off wellbutrin. .   ROS:  GEN/ HEENT: No fever, significant weight changes sweats headaches vision problems hearing changes, CV/ PULM; No chest pain shortness of breath cough,  syncope,edema  change in exercise tolerance. GI /GU: No adominal pain, vomiting, change in bowel habits.battles constipation No blood in the stool. No significant GU symptoms. SKIN/HEME: ,no acute skin rashes suspicious lesions or bleeding. No lymphadenopathy, nodules, masses.  NEURO/ PSYCH:  No neurologic signs such as weakness numbness. No depression anxiety. IMM/ Allergy: No unusual infections.  Allergy .   REST of 12 system review negative except as per HPI   Past Medical History  Diagnosis Date  . Hyperlipidemia   . Hypertension   . Depression     Family History  Problem Relation Age of Onset  . Crohn's disease Daughter   . Cancer Maternal Grandfather     colon  . Diabetes Paternal Grandfather    Past Surgical History  Procedure Laterality Date  . Chidbirth      x2  . Colonoscopy  03/00    2/10- normal     History   Social History  . Marital Status: Married    Spouse Name: N/A    Number of Children: N/A  . Years of Education: N/A   Social History Main Topics  . Smoking status: Never Smoker   . Smokeless tobacco: Never Used  . Alcohol Use: Yes     Comment: 2 glasses of wine daily   . Drug Use: No  . Sexually Active: None   Other Topics Concern  .  None   Social History Narrative   No tobacco    hh of 2   A cat    etoh 2-3 wine  per day    With meals    Married one son and one daughter   Exercise well.    Occupation:  Former Careers information officer Prescriptions as of 06/11/2013  Medication Sig Dispense Refill  . calcium-vitamin D (OSCAL) 250-125 MG-UNIT per tablet Take 1 tablet by mouth daily.      . Multiple Vitamins-Minerals (WOMENS ONE DAILY PO) Take by mouth daily.      . [DISCONTINUED] buPROPion (WELLBUTRIN SR) 100 MG 12 hr tablet Take 1 tablet (100 mg total) by mouth daily.  30 tablet  2  . [DISCONTINUED] diphenhydrAMINE (BENADRYL) 25 mg capsule Take 25 mg by mouth every 6 (six) hours as needed.      . [DISCONTINUED] Erythromycin 2 %  ointment Apply very small amount twice daily for 1 week  25 g  0   No facility-administered encounter medications on file as of 06/11/2013.    EXAM:  BP 116/80  Pulse 63  Temp(Src) 98.2 F (36.8 C) (Oral)  Ht 5' 4.5" (1.638 m)  Wt 105 lb (47.628 kg)  BMI 17.75 kg/m2  SpO2 98%  Body mass index is 17.75 kg/(m^2).  Physical Exam: Vital signs reviewed WUJ:WJXB is a well-developed well-nourished alert cooperative   who appears stated age in no acute distress.  HEENT: normocephalic atraumatic , Eyes: PERRL EOM's full, conjunctiva clear, Nares: paten,t no deformity discharge or tenderness., Ears: no deformity EAC's clear TMs with normal landmarks. Mouth: clear OP, no lesions, edema.  Moist mucous membranes. Dentition in adequate repair. NECK: supple without masses, thyromegaly or bruits. CHEST/PULM:  Clear to auscultation and percussion breath sounds equal no wheeze , rales or rhonchi. No chest wall deformities or tenderness. Breast: normal by inspection . No dimpling, discharge, masses, tenderness or discharge . CV: PMI is nondisplaced, S1 S2 no gallops, murmurs, rubs. Peripheral pulses are full without delay.No JVD .  ABDOMEN: Bowel sounds normal nontender  No guard or rebound, no hepato splenomegal no CVA tenderness.  No hernia. Extremtities:  No clubbing cyanosis or edema, no acute joint swelling or redness no focal atrophy NEURO:  Oriented x3, cranial nerves 3-12 appear to be intact, no obvious focal weakness,gait within normal limits no abnormal reflexes or asymmetrical SKIN: No acute rashes normal turgor, color, no bruising or petechiae.  2 ska right back one is excoriated  No flaking. Right eye lid small 2mm flaking area lateral eyelid  Only seen with magnification.  PSYCH: Oriented, good eye contact, no obvious depression anxiety, cognition and judgment appear normal. LN: no cervical axillary inguinal adenopathy No noted deficits in memory, attention, and speech. Pelvic: NL ext GU,  labia clear without lesions or rash . Vagina no lesions .Cervix: clear  UTERUS: Neg CMT Adnexa:  clear no masses . PAP done Rectal neg mass stool heme negative    Lab Results  Component Value Date   WBC 4.9 12/08/2011   HGB 12.5 12/08/2011   HCT 37.3 12/08/2011   PLT 235.0 12/08/2011   GLUCOSE 100* 12/08/2011   CHOL 234* 12/08/2011   TRIG 89.0 12/08/2011   HDL 87.10 12/08/2011   LDLDIRECT 122.4 12/08/2011   ALT 13 12/08/2011   AST 18 12/08/2011   NA 141 12/08/2011   K 5.2* 12/08/2011   CL 106 12/08/2011   CREATININE 0.9 12/08/2011   BUN 17 12/08/2011  CO2 30 12/08/2011   TSH 1.90 12/08/2011    ASSESSMENT AND PLAN:  Discussed the following assessment and plan:  Welcome to Medicare preventive visit - Plan: HM COLONOSCOPY, Pneumococcal polysaccharide vaccine 23-valent greater than or equal to 2yo subcutaneous/IM, Basic metabolic panel, CBC with Differential, Hepatic function panel, Lipid panel, TSH, PAP [[place]]  Medicare annual wellness visit, initial - Plan: HM COLONOSCOPY, Pneumococcal polysaccharide vaccine 23-valent greater than or equal to 2yo subcutaneous/IM, Basic metabolic panel, CBC with Differential, Hepatic function panel, Lipid panel, TSH, PAP [[place]]  Need for prophylactic vaccination against Streptococcus pneumoniae (pneumococcus) - Plan: HM COLONOSCOPY, Pneumococcal polysaccharide vaccine 23-valent greater than or equal to 2yo subcutaneous/IM, Basic metabolic panel, CBC with Differential, Hepatic function panel, Lipid panel, TSH  Osteopenia - Plan: HM COLONOSCOPY, Pneumococcal polysaccharide vaccine 23-valent greater than or equal to 2yo subcutaneous/IM, Basic metabolic panel, CBC with Differential, Hepatic function panel, Lipid panel, TSH  HYPERLIPIDEMIA - Plan: HM COLONOSCOPY, Pneumococcal polysaccharide vaccine 23-valent greater than or equal to 2yo subcutaneous/IM, Basic metabolic panel, CBC with Differential, Hepatic function panel, Lipid panel, TSH  Encounter for  routine gynecological examination - Plan: PAP [[place]]  Skin lesion - right eye lid see eye doc poss derm if persitsant progressive  Patient Care Team: Madelin Headings, MD as PCP - General  Patient Instructions  Go back on the calcium vitamin D. Advise bone density 2 years and the last one.  I think the skin area is a seborrheic keratosis which is not a mole however is growing in size or bleeding we can remove it to make sure. Make an appointment for skin lesion removal if you wish. Otherwise we can check it at your visits. We'll let you know about lab results when available he can sign up for my chart. Access. If sleep as an issue can try limiting your alcohol intake at night as this interferes with sleep. Get a good eye exam from an ophthalmologist to rule out any impending eye disease. Can return for shingles vaccine if you wish ;nursing visit only.   Preventive Care for Adults, Female A healthy lifestyle and preventive care can promote health and wellness. Preventive health guidelines for women include the following key practices.  A routine yearly physical is a good way to check with your caregiver about your health and preventive screening. It is a chance to share any concerns and updates on your health, and to receive a thorough exam.  Visit your dentist for a routine exam and preventive care every 6 months. Brush your teeth twice a day and floss once a day. Good oral hygiene prevents tooth decay and gum disease.  The frequency of eye exams is based on your age, health, family medical history, use of contact lenses, and other factors. Follow your caregiver's recommendations for frequency of eye exams.  Eat a healthy diet. Foods like vegetables, fruits, whole grains, low-fat dairy products, and lean protein foods contain the nutrients you need without too many calories. Decrease your intake of foods high in solid fats, added sugars, and salt. Eat the right amount of calories for  you.Get information about a proper diet from your caregiver, if necessary.  Regular physical exercise is one of the most important things you can do for your health. Most adults should get at least 150 minutes of moderate-intensity exercise (any activity that increases your heart rate and causes you to sweat) each week. In addition, most adults need muscle-strengthening exercises on 2 or more days a  week.  Maintain a healthy weight. The body mass index (BMI) is a screening tool to identify possible weight problems. It provides an estimate of body fat based on height and weight. Your caregiver can help determine your BMI, and can help you achieve or maintain a healthy weight.For adults 20 years and older:  A BMI below 18.5 is considered underweight.  A BMI of 18.5 to 24.9 is normal.  A BMI of 25 to 29.9 is considered overweight.  A BMI of 30 and above is considered obese.  Maintain normal blood lipids and cholesterol levels by exercising and minimizing your intake of saturated fat. Eat a balanced diet with plenty of fruit and vegetables. Blood tests for lipids and cholesterol should begin at age 44 and be repeated every 5 years. If your lipid or cholesterol levels are high, you are over 50, or you are at high risk for heart disease, you may need your cholesterol levels checked more frequently.Ongoing high lipid and cholesterol levels should be treated with medicines if diet and exercise are not effective.  If you smoke, find out from your caregiver how to quit. If you do not use tobacco, do not start.  If you are pregnant, do not drink alcohol. If you are breastfeeding, be very cautious about drinking alcohol. If you are not pregnant and choose to drink alcohol, do not exceed 1 drink per day. One drink is considered to be 12 ounces (355 mL) of beer, 5 ounces (148 mL) of wine, or 1.5 ounces (44 mL) of liquor.  Avoid use of street drugs. Do not share needles with anyone. Ask for help if you need  support or instructions about stopping the use of drugs.  High blood pressure causes heart disease and increases the risk of stroke. Your blood pressure should be checked at least every 1 to 2 years. Ongoing high blood pressure should be treated with medicines if weight loss and exercise are not effective.  If you are 33 to 65 years old, ask your caregiver if you should take aspirin to prevent strokes.  Diabetes screening involves taking a blood sample to check your fasting blood sugar level. This should be done once every 3 years, after age 65, if you are within normal weight and without risk factors for diabetes. Testing should be considered at a younger age or be carried out more frequently if you are overweight and have at least 1 risk factor for diabetes.  Breast cancer screening is essential preventive care for women. You should practice "breast self-awareness." This means understanding the normal appearance and feel of your breasts and may include breast self-examination. Any changes detected, no matter how small, should be reported to a caregiver. Women in their 32s and 30s should have a clinical breast exam (CBE) by a caregiver as part of a regular health exam every 1 to 3 years. After age 34, women should have a CBE every year. Starting at age 72, women should consider having a mammography (breast X-ray test) every year. Women who have a family history of breast cancer should talk to their caregiver about genetic screening. Women at a high risk of breast cancer should talk to their caregivers about having magnetic resonance imaging (MRI) and a mammography every year.  The Pap test is a screening test for cervical cancer. A Pap test can show cell changes on the cervix that might become cervical cancer if left untreated. A Pap test is a procedure in which cells are obtained and examined  from the lower end of the uterus (cervix).  Women should have a Pap test starting at age 9.  Between ages 11  and 3, Pap tests should be repeated every 2 years.  Beginning at age 72, you should have a Pap test every 3 years as long as the past 3 Pap tests have been normal.  Some women have medical problems that increase the chance of getting cervical cancer. Talk to your caregiver about these problems. It is especially important to talk to your caregiver if a new problem develops soon after your last Pap test. In these cases, your caregiver may recommend more frequent screening and Pap tests.  The above recommendations are the same for women who have or have not gotten the vaccine for human papillomavirus (HPV).  If you had a hysterectomy for a problem that was not cancer or a condition that could lead to cancer, then you no longer need Pap tests. Even if you no longer need a Pap test, a regular exam is a good idea to make sure no other problems are starting.  If you are between ages 64 and 39, and you have had normal Pap tests going back 10 years, you no longer need Pap tests. Even if you no longer need a Pap test, a regular exam is a good idea to make sure no other problems are starting.  If you have had past treatment for cervical cancer or a condition that could lead to cancer, you need Pap tests and screening for cancer for at least 20 years after your treatment.  If Pap tests have been discontinued, risk factors (such as a new sexual partner) need to be reassessed to determine if screening should be resumed.  The HPV test is an additional test that may be used for cervical cancer screening. The HPV test looks for the virus that can cause the cell changes on the cervix. The cells collected during the Pap test can be tested for HPV. The HPV test could be used to screen women aged 15 years and older, and should be used in women of any age who have unclear Pap test results. After the age of 74, women should have HPV testing at the same frequency as a Pap test.  Colorectal cancer can be detected and  often prevented. Most routine colorectal cancer screening begins at the age of 61 and continues through age 46. However, your caregiver may recommend screening at an earlier age if you have risk factors for colon cancer. On a yearly basis, your caregiver may provide home test kits to check for hidden blood in the stool. Use of a small camera at the end of a tube, to directly examine the colon (sigmoidoscopy or colonoscopy), can detect the earliest forms of colorectal cancer. Talk to your caregiver about this at age 103, when routine screening begins. Direct examination of the colon should be repeated every 5 to 10 years through age 18, unless early forms of pre-cancerous polyps or small growths are found.  Hepatitis C blood testing is recommended for all people born from [birth year] through 1963 and any individual with known risks for hepatitis C.  Practice safe sex. Use condoms and avoid high-risk sexual practices to reduce the spread of sexually transmitted infections (STIs). STIs include gonorrhea, chlamydia, syphilis, trichomonas, herpes, HPV, and human immunodeficiency virus (HIV). Herpes, HIV, and HPV are viral illnesses that have no cure. They can result in disability, cancer, and death. Sexually active women aged 64 and younger  should be checked for chlamydia. Older women with new or multiple partners should also be tested for chlamydia. Testing for other STIs is recommended if you are sexually active and at increased risk.  Osteoporosis is a disease in which the bones lose minerals and strength with aging. This can result in serious bone fractures. The risk of osteoporosis can be identified using a bone density scan. Women ages 56 and over and women at risk for fractures or osteoporosis should discuss screening with their caregivers. Ask your caregiver whether you should take a calcium supplement or vitamin D to reduce the rate of osteoporosis.  Menopause can be associated with physical symptoms and risks.  Hormone replacement therapy is available to decrease symptoms and risks. You should talk to your caregiver about whether hormone replacement therapy is right for you.  Use sunscreen with sun protection factor (SPF) of 30 or more. Apply sunscreen liberally and repeatedly throughout the day. You should seek shade when your shadow is shorter than you. Protect yourself by wearing long sleeves, pants, a wide-brimmed hat, and sunglasses year round, whenever you are outdoors.  Once a month, do a whole body skin exam, using a mirror to look at the skin on your back. Notify your caregiver of new moles, moles that have irregular borders, moles that are larger than a pencil eraser, or moles that have changed in shape or color.  Stay current with required immunizations.  Influenza. You need a dose every fall (or winter). The composition of the flu vaccine changes each year, so being vaccinated once is not enough.  Pneumococcal polysaccharide. You need 1 to 2 doses if you smoke cigarettes or if you have certain chronic medical conditions. You need 1 dose at age 60 (or older) if you have never been vaccinated.  Tetanus, diphtheria, pertussis (Tdap, Td). Get 1 dose of Tdap vaccine if you are younger than age 25, are over 66 and have contact with an infant, are a Research scientist (physical sciences), are pregnant, or simply want to be protected from whooping cough. After that, you need a Td booster dose every 10 years. Consult your caregiver if you have not had at least 3 tetanus and diphtheria-containing shots sometime in your life or have a deep or dirty wound.  HPV. You need this vaccine if you are a woman age 71 or younger. The vaccine is given in 3 doses over 6 months.  Measles, mumps, rubella (MMR). You need at least 1 dose of MMR if you were born in 1957 or later. You may also need a second dose.  Meningococcal. If you are age 50 to 16 and a first-year college student living in a residence hall, or have one of several medical  conditions, you need to get vaccinated against meningococcal disease. You may also need additional booster doses.  Zoster (shingles). If you are age 55 or older, you should get this vaccine.  Varicella (chickenpox). If you have never had chickenpox or you were vaccinated but received only 1 dose, talk to your caregiver to find out if you need this vaccine.  Hepatitis A. You need this vaccine if you have a specific risk factor for hepatitis A virus infection or you simply wish to be protected from this disease. The vaccine is usually given as 2 doses, 6 to 18 months apart.  Hepatitis B. You need this vaccine if you have a specific risk factor for hepatitis B virus infection or you simply wish to be protected from this disease. The vaccine  is given in 3 doses, usually over 6 months.     [name]. Panosh M.D.  Health Maintenance  Topic Date Due  . Zostavax  01/10/2008  . Influenza Vaccine  07/15/2013  . Mammogram  12/06/2014  . Colonoscopy  12/30/2018  . Tetanus/tdap  08/10/2020  . Pneumococcal Polysaccharide Vaccine Age 76 And Over  Completed   Health Maintenance Review

## 2013-06-11 NOTE — Patient Instructions (Addendum)
Go back on the calcium vitamin D. Advise bone density 2 years and the last one.  I think the skin area is a seborrheic keratosis which is not a mole however is growing in size or bleeding we can remove it to make sure. Make an appointment for skin lesion removal if you wish. Otherwise we can check it at your visits. We'll let you know about lab results when available he can sign up for my chart. Access. If sleep as an issue can try limiting your alcohol intake at night as this interferes with sleep. Get a good eye exam from an ophthalmologist to rule out any impending eye disease. Can return for shingles vaccine if you wish ;nursing visit only.   Preventive Care for Adults, Female A healthy lifestyle and preventive care can promote health and wellness. Preventive health guidelines for women include the following key practices.  A routine yearly physical is a good way to check with your caregiver about your health and preventive screening. It is a chance to share any concerns and updates on your health, and to receive a thorough exam.  Visit your dentist for a routine exam and preventive care every 6 months. Brush your teeth twice a day and floss once a day. Good oral hygiene prevents tooth decay and gum disease.  The frequency of eye exams is based on your age, health, family medical history, use of contact lenses, and other factors. Follow your caregiver's recommendations for frequency of eye exams.  Eat a healthy diet. Foods like vegetables, fruits, whole grains, low-fat dairy products, and lean protein foods contain the nutrients you need without too many calories. Decrease your intake of foods high in solid fats, added sugars, and salt. Eat the right amount of calories for you.Get information about a proper diet from your caregiver, if necessary.  Regular physical exercise is one of the most important things you can do for your health. Most adults should get at least 150 minutes of  moderate-intensity exercise (any activity that increases your heart rate and causes you to sweat) each week. In addition, most adults need muscle-strengthening exercises on 2 or more days a week.  Maintain a healthy weight. The body mass index (BMI) is a screening tool to identify possible weight problems. It provides an estimate of body fat based on height and weight. Your caregiver can help determine your BMI, and can help you achieve or maintain a healthy weight.For adults 20 years and older:  A BMI below 18.5 is considered underweight.  A BMI of 18.5 to 24.9 is normal.  A BMI of 25 to 29.9 is considered overweight.  A BMI of 30 and above is considered obese.  Maintain normal blood lipids and cholesterol levels by exercising and minimizing your intake of saturated fat. Eat a balanced diet with plenty of fruit and vegetables. Blood tests for lipids and cholesterol should begin at age 44 and be repeated every 5 years. If your lipid or cholesterol levels are high, you are over 50, or you are at high risk for heart disease, you may need your cholesterol levels checked more frequently.Ongoing high lipid and cholesterol levels should be treated with medicines if diet and exercise are not effective.  If you smoke, find out from your caregiver how to quit. If you do not use tobacco, do not start.  If you are pregnant, do not drink alcohol. If you are breastfeeding, be very cautious about drinking alcohol. If you are not pregnant and choose to  drink alcohol, do not exceed 1 drink per day. One drink is considered to be 12 ounces (355 mL) of beer, 5 ounces (148 mL) of wine, or 1.5 ounces (44 mL) of liquor.  Avoid use of street drugs. Do not share needles with anyone. Ask for help if you need support or instructions about stopping the use of drugs.  High blood pressure causes heart disease and increases the risk of stroke. Your blood pressure should be checked at least every 1 to 2 years. Ongoing high  blood pressure should be treated with medicines if weight loss and exercise are not effective.  If you are 94 to 65 years old, ask your caregiver if you should take aspirin to prevent strokes.  Diabetes screening involves taking a blood sample to check your fasting blood sugar level. This should be done once every 3 years, after age 68, if you are within normal weight and without risk factors for diabetes. Testing should be considered at a younger age or be carried out more frequently if you are overweight and have at least 1 risk factor for diabetes.  Breast cancer screening is essential preventive care for women. You should practice "breast self-awareness." This means understanding the normal appearance and feel of your breasts and may include breast self-examination. Any changes detected, no matter how small, should be reported to a caregiver. Women in their 73s and 30s should have a clinical breast exam (CBE) by a caregiver as part of a regular health exam every 1 to 3 years. After age 35, women should have a CBE every year. Starting at age 56, women should consider having a mammography (breast X-ray test) every year. Women who have a family history of breast cancer should talk to their caregiver about genetic screening. Women at a high risk of breast cancer should talk to their caregivers about having magnetic resonance imaging (MRI) and a mammography every year.  The Pap test is a screening test for cervical cancer. A Pap test can show cell changes on the cervix that might become cervical cancer if left untreated. A Pap test is a procedure in which cells are obtained and examined from the lower end of the uterus (cervix).  Women should have a Pap test starting at age 50.  Between ages 49 and 70, Pap tests should be repeated every 2 years.  Beginning at age 19, you should have a Pap test every 3 years as long as the past 3 Pap tests have been normal.  Some women have medical problems that  increase the chance of getting cervical cancer. Talk to your caregiver about these problems. It is especially important to talk to your caregiver if a new problem develops soon after your last Pap test. In these cases, your caregiver may recommend more frequent screening and Pap tests.  The above recommendations are the same for women who have or have not gotten the vaccine for human papillomavirus (HPV).  If you had a hysterectomy for a problem that was not cancer or a condition that could lead to cancer, then you no longer need Pap tests. Even if you no longer need a Pap test, a regular exam is a good idea to make sure no other problems are starting.  If you are between ages 38 and 62, and you have had normal Pap tests going back 10 years, you no longer need Pap tests. Even if you no longer need a Pap test, a regular exam is a good idea to make  sure no other problems are starting.  If you have had past treatment for cervical cancer or a condition that could lead to cancer, you need Pap tests and screening for cancer for at least 20 years after your treatment.  If Pap tests have been discontinued, risk factors (such as a new sexual partner) need to be reassessed to determine if screening should be resumed.  The HPV test is an additional test that may be used for cervical cancer screening. The HPV test looks for the virus that can cause the cell changes on the cervix. The cells collected during the Pap test can be tested for HPV. The HPV test could be used to screen women aged 28 years and older, and should be used in women of any age who have unclear Pap test results. After the age of 68, women should have HPV testing at the same frequency as a Pap test.  Colorectal cancer can be detected and often prevented. Most routine colorectal cancer screening begins at the age of 52 and continues through age 73. However, your caregiver may recommend screening at an earlier age if you have risk factors for colon  cancer. On a yearly basis, your caregiver may provide home test kits to check for hidden blood in the stool. Use of a small camera at the end of a tube, to directly examine the colon (sigmoidoscopy or colonoscopy), can detect the earliest forms of colorectal cancer. Talk to your caregiver about this at age 86, when routine screening begins. Direct examination of the colon should be repeated every 5 to 10 years through age 55, unless early forms of pre-cancerous polyps or small growths are found.  Hepatitis C blood testing is recommended for all people born from [birth year] through 1963 and any individual with known risks for hepatitis C.  Practice safe sex. Use condoms and avoid high-risk sexual practices to reduce the spread of sexually transmitted infections (STIs). STIs include gonorrhea, chlamydia, syphilis, trichomonas, herpes, HPV, and human immunodeficiency virus (HIV). Herpes, HIV, and HPV are viral illnesses that have no cure. They can result in disability, cancer, and death. Sexually active women aged 64 and younger should be checked for chlamydia. Older women with new or multiple partners should also be tested for chlamydia. Testing for other STIs is recommended if you are sexually active and at increased risk.  Osteoporosis is a disease in which the bones lose minerals and strength with aging. This can result in serious bone fractures. The risk of osteoporosis can be identified using a bone density scan. Women ages 43 and over and women at risk for fractures or osteoporosis should discuss screening with their caregivers. Ask your caregiver whether you should take a calcium supplement or vitamin D to reduce the rate of osteoporosis.  Menopause can be associated with physical symptoms and risks. Hormone replacement therapy is available to decrease symptoms and risks. You should talk to your caregiver about whether hormone replacement therapy is right for you.  Use sunscreen with sun protection factor  (SPF) of 30 or more. Apply sunscreen liberally and repeatedly throughout the day. You should seek shade when your shadow is shorter than you. Protect yourself by wearing long sleeves, pants, a wide-brimmed hat, and sunglasses year round, whenever you are outdoors.  Once a month, do a whole body skin exam, using a mirror to look at the skin on your back. Notify your caregiver of new moles, moles that have irregular borders, moles that are larger than a pencil eraser,  or moles that have changed in shape or color.  Stay current with required immunizations.  Influenza. You need a dose every fall (or winter). The composition of the flu vaccine changes each year, so being vaccinated once is not enough.  Pneumococcal polysaccharide. You need 1 to 2 doses if you smoke cigarettes or if you have certain chronic medical conditions. You need 1 dose at age 57 (or older) if you have never been vaccinated.  Tetanus, diphtheria, pertussis (Tdap, Td). Get 1 dose of Tdap vaccine if you are younger than age 36, are over 32 and have contact with an infant, are a Research scientist (physical sciences), are pregnant, or simply want to be protected from whooping cough. After that, you need a Td booster dose every 10 years. Consult your caregiver if you have not had at least 3 tetanus and diphtheria-containing shots sometime in your life or have a deep or dirty wound.  HPV. You need this vaccine if you are a woman age 13 or younger. The vaccine is given in 3 doses over 6 months.  Measles, mumps, rubella (MMR). You need at least 1 dose of MMR if you were born in 1957 or later. You may also need a second dose.  Meningococcal. If you are age 70 to 9 and a first-year college student living in a residence hall, or have one of several medical conditions, you need to get vaccinated against meningococcal disease. You may also need additional booster doses.  Zoster (shingles). If you are age 88 or older, you should get this vaccine.  Varicella  (chickenpox). If you have never had chickenpox or you were vaccinated but received only 1 dose, talk to your caregiver to find out if you need this vaccine.  Hepatitis A. You need this vaccine if you have a specific risk factor for hepatitis A virus infection or you simply wish to be protected from this disease. The vaccine is usually given as 2 doses, 6 to 18 months apart.  Hepatitis B. You need this vaccine if you have a specific risk factor for hepatitis B virus infection or you simply wish to be protected from this disease. The vaccine is given in 3 doses, usually over 6 months.

## 2013-09-26 ENCOUNTER — Telehealth: Payer: Self-pay | Admitting: Internal Medicine

## 2013-09-26 NOTE — Telephone Encounter (Signed)
Pt would like rx for shingles vac sent to [facility]/ [address]

## 2013-09-27 ENCOUNTER — Ambulatory Visit (INDEPENDENT_AMBULATORY_CARE_PROVIDER_SITE_OTHER): Payer: Medicare Other | Admitting: Family Medicine

## 2013-09-27 ENCOUNTER — Other Ambulatory Visit: Payer: Self-pay | Admitting: Family Medicine

## 2013-09-27 DIAGNOSIS — Z23 Encounter for immunization: Secondary | ICD-10-CM

## 2013-09-27 MED ORDER — ZOSTER VACCINE LIVE 19400 UNT/0.65ML ~~LOC~~ SOLR: 0.6500 mL | Freq: Once | SUBCUTANEOUS | Status: DC

## 2013-09-27 NOTE — Telephone Encounter (Signed)
Sent by e-scribe. 

## 2014-01-03 ENCOUNTER — Telehealth: Payer: Self-pay | Admitting: Internal Medicine

## 2014-01-03 DIAGNOSIS — R922 Inconclusive mammogram: Secondary | ICD-10-CM

## 2014-01-03 NOTE — Telephone Encounter (Signed)
Pt would like t know if dr Dorann Oupasnosh would like her to have the 3 d mammogram insteasd of the traditional one this year,  Pt received a notice offering the 3 d . Pt had to have the 3 d last year, but no issues were found.  If 3 -d needed, pt will need referral. pls advise.

## 2014-01-08 ENCOUNTER — Other Ambulatory Visit: Payer: Self-pay | Admitting: Family Medicine

## 2014-01-08 NOTE — Telephone Encounter (Signed)
Ok to do referral for 3d mammogram  Dx dense breasts dont know if this go through

## 2014-01-13 NOTE — Telephone Encounter (Signed)
Patient notified that a fax has been sent to [facility].  Confirmation received at the fax went through.

## 2014-03-17 ENCOUNTER — Encounter: Payer: Self-pay | Admitting: Internal Medicine

## 2014-04-23 ENCOUNTER — Ambulatory Visit: Payer: Medicare Other | Admitting: Internal Medicine

## 2014-09-12 ENCOUNTER — Ambulatory Visit (INDEPENDENT_AMBULATORY_CARE_PROVIDER_SITE_OTHER): Payer: Medicare Other | Admitting: Internal Medicine

## 2014-09-12 ENCOUNTER — Encounter: Payer: Self-pay | Admitting: Internal Medicine

## 2014-09-12 VITALS — BP 124/80 | Temp 97.9°F | Ht 63.75 in | Wt 107.0 lb

## 2014-09-12 DIAGNOSIS — E785 Hyperlipidemia, unspecified: Secondary | ICD-10-CM

## 2014-09-12 DIAGNOSIS — Z23 Encounter for immunization: Secondary | ICD-10-CM

## 2014-09-12 DIAGNOSIS — M8580 Other specified disorders of bone density and structure, unspecified site: Secondary | ICD-10-CM

## 2014-09-12 DIAGNOSIS — Z0000 Encounter for general adult medical examination without abnormal findings: Secondary | ICD-10-CM

## 2014-09-12 LAB — CBC WITH DIFFERENTIAL/PLATELET
BASOS ABS: 0 10*3/uL (ref 0.0–0.1)
Basophils Relative: 0.5 % (ref 0.0–3.0)
EOS PCT: 3.7 % (ref 0.0–5.0)
Eosinophils Absolute: 0.2 10*3/uL (ref 0.0–0.7)
HCT: 39.5 % (ref 36.0–46.0)
HEMOGLOBIN: 12.9 g/dL (ref 12.0–15.0)
LYMPHS PCT: 25.1 % (ref 12.0–46.0)
Lymphs Abs: 1.5 10*3/uL (ref 0.7–4.0)
MCHC: 32.5 g/dL (ref 30.0–36.0)
MCV: 98.6 fl (ref 78.0–100.0)
Monocytes Absolute: 0.4 10*3/uL (ref 0.1–1.0)
Monocytes Relative: 7.2 % (ref 3.0–12.0)
Neutro Abs: 3.9 10*3/uL (ref 1.4–7.7)
Neutrophils Relative %: 63.5 % (ref 43.0–77.0)
PLATELETS: 235 10*3/uL (ref 150.0–400.0)
RBC: 4 Mil/uL (ref 3.87–5.11)
RDW: 14.3 % (ref 11.5–15.5)
WBC: 6.2 10*3/uL (ref 4.0–10.5)

## 2014-09-12 LAB — LIPID PANEL
CHOL/HDL RATIO: 2
Cholesterol: 254 mg/dL — ABNORMAL HIGH (ref 0–200)
HDL: 109.9 mg/dL (ref >39.00)
LDL Cholesterol: 126 mg/dL — ABNORMAL HIGH (ref 0–99)
NonHDL: 144.1
Triglycerides: 90 mg/dL (ref 0.0–149.0)
VLDL: 18 mg/dL (ref 0.0–40.0)

## 2014-09-12 LAB — BASIC METABOLIC PANEL
BUN: 20 mg/dL (ref 6–23)
CALCIUM: 10.1 mg/dL (ref 8.4–10.5)
CO2: 29 mEq/L (ref 19–32)
Chloride: 101 mEq/L (ref 96–112)
Creatinine, Ser: 0.8 mg/dL (ref 0.4–1.2)
GFR: 78.37 mL/min (ref >60.00)
GLUCOSE: 103 mg/dL — AB (ref 70–99)
Potassium: 4.7 mEq/L (ref 3.5–5.1)
SODIUM: 141 meq/L (ref 135–145)

## 2014-09-12 LAB — HEPATIC FUNCTION PANEL
ALK PHOS: 54 U/L (ref 39–117)
ALT: 18 U/L (ref 0–35)
AST: 27 U/L (ref 0–37)
Albumin: 4 g/dL (ref 3.5–5.2)
Bilirubin, Direct: 0.1 mg/dL (ref 0.0–0.3)
Total Bilirubin: 1.2 mg/dL (ref 0.2–1.2)
Total Protein: 7.3 g/dL (ref 6.0–8.3)

## 2014-09-12 LAB — TSH: TSH: 1.55 u[IU]/mL (ref 0.35–4.50)

## 2014-09-12 NOTE — Patient Instructions (Addendum)
Dexa scan in January 2 years out .  Can get at time of mammo if you call [facility] about this  Get a yearly flu vaccine  mammo every 1-2 years  Colon scopy every 10 years  Not due until 2020. Hip issues sounds like bursitis  and overuse . consider seeing sports medicine if  persistent or progressive . Not from the osteopenia  Continue lifestyle intervention healthy eating and exercise . Healthy lifestyle includes : At least 150 minutes of exercise weeks  , weight at healthy levels, which is usually   BMI 19-25. Avoid trans fats and processed foods;  Increase fresh fruits and veges to 5 servings per day. And avoid sweet beverages including tea and juice. Mediterranean diet with olive oil and nuts have been noted to be heart and brain healthy . Avoid tobacco products . Limit  alcohol to  7 per week for women and 14 servings for men.  Get adequate sleep . Wear seat belts . Don't text and drive .

## 2014-09-12 NOTE — Progress Notes (Signed)
Pre visit review using our clinic review tool, if applicable. No additional management support is needed unless otherwise documented below in the visit note.  Chief Complaint  Patient presents with  . Medicare Wellness    HPI: Patient comes in today for Preventive Medicare wellness visit . No major injuries, ed visits ,hospitalizations , new medications since last visit. Left hip pain when lay on it or sleep  acarries heavy grand child  Has osteopenia ? When next scan due?  Health Maintenance  Topic Date Due  . Influenza Vaccine  06/15/2015  . Mammogram  02/06/2016  . Colonoscopy  12/30/2018  . Tetanus/tdap  08/10/2020  . Pneumococcal Polysaccharide Vaccine Age 66 And Over  Completed  . Zostavax  Addressed   Health Maintenance Review  LIFESTYLE:  Exercise:  Aerobics weights 3-4  And elliptical  11 minutes  After .    Tobacco/ETS: no Alcohol: 3 per days. Sugar beverages:  no Sleep:  Improved . 11-8 usually .  Drug use: no  Bone density: osteopenia   Colonoscopy: q 10 year  Due 2 2020  Hearing:  ok  Vision:  No limitations at present . Last eye check UTD  Safety:  Has smoke detector and wears seat belts.  No firearms. No excess sun exposure. Sees dentist regularly.  Falls: no  Advance directive :  Reviewed   Memory: Felt to be good  , no concern from her or her family.  Depression: No anhedonia unusual crying or depressive symptoms  Nutrition: Eats well balanced diet; adequate calcium and vitamin D. No swallowing chewing problems.  Injury: no major injuries in the last six months.  Other healthcare providers:  Reviewed today .  Social:  Lives with spouse married. No pets.going to get a parakeet    Preventive parameters: up-to-date  Reviewed   ADLS:   There are no problems or need for assistance  driving, feeding, obtaining food, dressing, toileting and bathing, managing money using phone. She is independent.  ROS:  GEN/ HEENT: No fever, significant weight  changes sweats headaches vision problems hearing changes, CV/ PULM; No chest pain shortness of breath cough, syncope,edema  change in exercise tolerance. GI /GU: No adominal pain, vomiting, change in bowel habits. No blood in the stool. No significant GU symptoms. SKIN/HEME: ,no acute skin rashes suspicious lesions or bleeding. No lymphadenopathy, nodules, masses.  NEURO/ PSYCH:  No neurologic signs such as weakness numbness. No depression anxiety. IMM/ Allergy: No unusual infections.  Allergy .   REST of 12 system review negative except as per HPI   Past Medical History  Diagnosis Date  . Hyperlipidemia   . Hypertension   . Depression     Family History  Problem Relation Age of Onset  . Crohn's disease Daughter   . Cancer Maternal Grandfather     colon  . Diabetes Paternal Grandfather     History   Social History  . Marital Status: Married    Spouse Name: N/A    Number of Children: N/A  . Years of Education: N/A   Social History Main Topics  . Smoking status: Never Smoker   . Smokeless tobacco: Never Used  . Alcohol Use: Yes     Comment: 2 glasses of wine daily   . Drug Use: No  . Sexual Activity: None   Other Topics Concern  . None   Social History Narrative   No tobacco    hh of 2   A cat    etoh 2-3  wine  per day    With meals    Married one son and one daughter   Exercise well.    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 calcium-vitamin D (OSCAL) 250-125 MG-UNIT per tablet Take 1 tablet by mouth daily.  . Multiple Vitamins-Minerals (WOMENS ONE DAILY PO) Take by mouth daily.  . [DISCONTINUED] zoster vaccine live, PF, (ZOSTAVAX) [id number] UNT/0.65ML injection Inject 19,400 Units into the skin once.    EXAM:  BP 124/80  Temp(Src) 97.9 F (36.6 C) (Oral)  Ht 5' 3.75" (1.619 m)  Wt 107 lb (48.535 kg)  BMI 18.52 kg/m2  Body mass index is 18.52 kg/(m^2).  Physical Exam: Vital signs  reviewed UEA:[id number]:This is a well-developed well-nourished alert cooperative   who appears stated age in no acute distress.  HEENT: normocephalic atraumatic , Eyes: PERRL EOM's full, conjunctiva clear, Nares: paten,t no deformity discharge or tenderness., Ears: no deformity EAC's clear TMs with normal landmarks. Mouth: clear OP, no lesions, edema.  Moist mucous membranes. Dentition in adequate repair. NECK: supple without masses, thyromegaly or bruits. CHEST/PULM:  Clear to auscultation and percussion breath sounds equal no wheeze , rales or rhonchi. No chest wall deformities or tenderness. CV: PMI is nondisplaced, S1 S2 no gallops, murmurs, rubs. Peripheral pulses are full without delay.No JVD .  Breast: normal by inspection . No dimpling, discharge, masses, tenderness or discharge . ABDOMEN: Bowel sounds normal nontender  No guard or rebound, no hepato splenomegal no CVA tenderness.  No hernia. Extremtities:  No clubbing cyanosis or edema, no acute joint swelling or redness no focal atrophy hip good rom  NEURO:  Oriented x3, cranial nerves 3-12 appear to be intact, no obvious focal weakness,gait within normal limits no abnormal reflexes or asymmetrical SKIN: No acute rashes normal turgor, color, no bruising or petechiae. PSYCH: Oriented, good eye contact, no obvious depression anxiety, cognition and judgment appear normal. LN: no cervical axillary inguinal adenopathy No noted deficits in memory, attention, and speech.  ASSESSMENT AND PLAN:  Discussed the following assessment and plan:  Visit for preventive health examination - prevnar 13 adn flu today utd screen hep c  - Plan: Basic metabolic panel, CBC with Differential, Hepatic function panel, Lipid panel, TSH, Hepatitis C antibody  Medicare annual wellness visit, initial - advance directives form given .   Osteopenia - dexa in jan 2 years hx of fosamax in past  - Plan: Basic metabolic panel, CBC with Differential, Hepatic function panel, Lipid  panel, TSH  Hyperlipidemia - Plan: Hepatic function panel, Lipid panel, TSH  Need for vaccination with 13-polyvalent pneumococcal conjugate vaccine - Plan: Pneumococcal conjugate vaccine 13-valent  Need for prophylactic vaccination and inoculation against influenza - Plan: Flu Vaccine QUAD 36+ mos PF IM (Fluarix Quad PF) Advance directive   Discussed and Ho given  Counseled regarding healthy nutrition, exercise, sleep, injury prevention, calcium vit d and healthy weight . consider sign up for my chart  Get dexa jan 2016  Patient Care Team: Madelin HeadingsWanda K Jeramine Delis, MD as PCP - General  Patient Instructions  Dexa scan in January 2 years out .  Can get at time of mammo if you call [facility] about this  Get a yearly flu vaccine  mammo every 1-2 years  Colon scopy every 10 years  Not due until 2020. Hip issues sounds like bursitis  and overuse . consider seeing sports medicine if  persistent or progressive . Not from the osteopenia  Continue lifestyle intervention healthy eating and exercise . Healthy lifestyle includes : At least 150 minutes of exercise weeks  , weight at healthy levels, which is usually   BMI 19-25. Avoid trans fats and processed foods;  Increase fresh fruits and veges to 5 servings per day. And avoid sweet beverages including tea and juice. Mediterranean diet with olive oil and nuts have been noted to be heart and brain healthy . Avoid tobacco products . Limit  alcohol to  7 per week for women and 14 servings for men.  Get adequate sleep . Wear seat belts . Don't text and drive .     [name]. Nicolaus Andel M.D.

## 2014-09-13 LAB — HEPATITIS C ANTIBODY: HCV Ab: NEGATIVE

## 2014-09-19 ENCOUNTER — Encounter: Payer: Self-pay | Admitting: Family Medicine

## 2015-02-11 LAB — HM MAMMOGRAPHY

## 2015-02-13 ENCOUNTER — Encounter: Payer: Self-pay | Admitting: Family Medicine

## 2015-03-11 ENCOUNTER — Encounter: Payer: Self-pay | Admitting: Internal Medicine

## 2015-06-03 ENCOUNTER — Encounter: Payer: Self-pay | Admitting: Internal Medicine

## 2015-06-11 ENCOUNTER — Ambulatory Visit (INDEPENDENT_AMBULATORY_CARE_PROVIDER_SITE_OTHER): Payer: Medicare Other | Admitting: Internal Medicine

## 2015-06-11 ENCOUNTER — Encounter: Payer: Self-pay | Admitting: Internal Medicine

## 2015-06-11 VITALS — BP 140/80 | Temp 98.7°F | Ht 63.75 in | Wt 104.0 lb

## 2015-06-11 DIAGNOSIS — F330 Major depressive disorder, recurrent, mild: Secondary | ICD-10-CM | POA: Diagnosis not present

## 2015-06-11 MED ORDER — BUPROPION HCL ER (XL) 150 MG PO TB24: 150.0000 mg | ORAL_TABLET | Freq: Every day | ORAL | Status: DC

## 2015-06-11 NOTE — Progress Notes (Signed)
Pre visit review using our clinic review tool, if applicable. No additional management support is needed unless otherwise documented below in the visit note.  Chief Complaint  Patient presents with  . Stress and Anxiety.    Hx of depression.    HPI: Patient Sheri Jensen  comes in today for   problem evaluation. "I could be  Happier  In my life " Some maritial counseling  marti rossi  . Also   Sex therapist  And to see again.  Advised seeing  pcp poss meds.   Per  Both counselors    Angry   With husband 2/3  In time. About . Dec etoh  to 2 per night  Sleep  Is good  stopped wellbutin in past cause poss sleep effect .  Sexual dysfunction before then.   Sex therapist felt she had some depression causing issues.  Avoiding taking med but willling to help  Has sadness at times  In past poss better off dead but not now  ROS: See pertinent positives and negatives per HPI. Hx reveiwe put on paxil years ago for anger and anxiety caused by depession  Got better after incitor got better  Uncertain if med also helped .   Then wellbutruin in past ( see notes)  Neg td   Past Medical History  Diagnosis Date  . Hyperlipidemia   . Hypertension   . Depression     Family History  Problem Relation Age of Onset  . Crohn's disease Daughter   . Cancer Maternal Grandfather     colon  . Diabetes Paternal Grandfather     History   Social History  . Marital Status: Married    Spouse Name: N/A  . Number of Children: N/A  . Years of Education: N/A   Social History Main Topics  . Smoking status: Never Smoker   . Smokeless tobacco: Never Used  . Alcohol Use: Yes     Comment: 2 glasses of wine daily   . Drug Use: No  . Sexual Activity: Not on file   Other Topics Concern  . None   Social History Narrative   No tobacco    hh of 2   A cat    etoh 2-3 wine  per day    With meals    Married one son and one daughter   Exercise well.    Occupation:  Former Programmer, systems    Outpatient  Prescriptions Prior to Visit  Medication Sig Dispense Refill  . calcium-vitamin D (OSCAL) 250-125 MG-UNIT per tablet Take 1 tablet by mouth daily.    . Multiple Vitamins-Minerals (WOMENS ONE DAILY PO) Take by mouth daily.     No facility-administered medications prior to visit.     EXAM:  BP 140/80 mmHg  Temp(Src) 98.7 F (37.1 C) (Oral)  Ht 5' 3.75" (1.619 m)  Wt 104 lb (47.174 kg)  BMI 18.00 kg/m2  Body mass index is 18 kg/(m^2).  GENERAL: vitals reviewed and listed above, alert, oriented, appears well hydrated and in no acute distress HEENT: atraumatic, conjunctiva  clear, no obvious abnormalities on inspection of external nose and earsMS: moves all extremities without noticeable focal  abnormality PSYCH: pleasant and cooperative, no obvious depression or anxiety good eye contact  D slightly depressed looking  phq9 7 2 2 1  1,3,4,6,7, in past may have had positive 1 on ?9   Somewhat difficult  ASSESSMENT AND PLAN:  Discussed the following assessment and plan:  Major depressive disorder,  recurrent episode, mild - with sexual dysfunction   marital counseling restart wellbutrin ( nopt as good for anxiety but no sex side effects ) fu 1 month At this  time pt prefers not ssri cause of se  Sexual possible  Can restart well and then decide on fu  Disc counseling    Expectant management.  Etc  May be  Helped by single counseling as well as  Couple counseling -Patient advised to return or notify health care team  if symptoms worsen ,persist or new concerns arise. Total visit 30 mins > 50% spent counseling and coordinating care as indicated in above note and in instructions to patient .   Patient Instructions  Restart wellbutin   Increase to 300 mg per day of xl version .  Can help depression and sometime sexual funcntion.( no worsening)   Continue counseling / consider individual alos.   .return office visit in 1 month to assess  med   [name]. Panosh M.D.

## 2015-06-11 NOTE — Patient Instructions (Signed)
Restart wellbutin   Increase to 300 mg per day of xl version .  Can help depression and sometime sexual funcntion.( no worsening)   Continue counseling / consider individual alos.   .return office visit in 1 month to assess  med

## 2015-07-13 ENCOUNTER — Ambulatory Visit (INDEPENDENT_AMBULATORY_CARE_PROVIDER_SITE_OTHER): Payer: Medicare Other | Admitting: Internal Medicine

## 2015-07-13 ENCOUNTER — Encounter: Payer: Self-pay | Admitting: Internal Medicine

## 2015-07-13 VITALS — BP 150/68 | Temp 98.4°F | Wt 102.0 lb

## 2015-07-13 DIAGNOSIS — F330 Major depressive disorder, recurrent, mild: Secondary | ICD-10-CM | POA: Diagnosis not present

## 2015-07-13 DIAGNOSIS — R030 Elevated blood-pressure reading, without diagnosis of hypertension: Secondary | ICD-10-CM | POA: Diagnosis not present

## 2015-07-13 DIAGNOSIS — Z23 Encounter for immunization: Secondary | ICD-10-CM

## 2015-07-13 DIAGNOSIS — L259 Unspecified contact dermatitis, unspecified cause: Secondary | ICD-10-CM

## 2015-07-13 DIAGNOSIS — IMO0001 Reserved for inherently not codable concepts without codable children: Secondary | ICD-10-CM

## 2015-07-13 MED ORDER — BUPROPION HCL ER (XL) 300 MG PO TB24: 300.0000 mg | ORAL_TABLET | Freq: Every day | ORAL | Status: DC

## 2015-07-13 NOTE — Progress Notes (Signed)
Pre visit review using our clinic review tool, if applicable. No additional management support is needed unless otherwise documented below in the visit note.  Chief Complaint  Patient presents with  . Follow-up    HPI: Sheri Jensen 69 y.o. fu of med for relationship depressive reactive and  sexual  Dysfunction still in couples counseling .   Only taking 1 per day  Sees no difference  But no se .  Rash under arms  itch ? From  [place] could be allergic .  bp had been better   After stopping allergy med in past   . Not checking readings at this time ? From stress.  ROS: See pertinent positives and negatives per HPI.  Past Medical History  Diagnosis Date  . Hyperlipidemia   . Hypertension   . Depression     Family History  Problem Relation Age of Onset  . Crohn's disease Daughter   . Cancer Maternal Grandfather     colon  . Diabetes Paternal Grandfather     Social History   Social History  . Marital Status: Married    Spouse Name: N/A  . Number of Children: N/A  . Years of Education: N/A   Social History Main Topics  . Smoking status: Never Smoker   . Smokeless tobacco: Never Used  . Alcohol Use: Yes     Comment: 2 glasses of wine daily   . Drug Use: No  . Sexual Activity: Not Asked   Other Topics Concern  . None   Social History Narrative   No tobacco    hh of 2   A cat    etoh 2-3 wine  per day    With meals    Married one son and one daughter   Exercise well.    Occupation:  Former Programmer, systems    Outpatient Prescriptions Prior to Visit  Medication Sig Dispense Refill  . calcium-vitamin D (OSCAL) 250-125 MG-UNIT per tablet Take 1 tablet by mouth daily.    . Multiple Vitamins-Minerals (WOMENS ONE DAILY PO) Take by mouth daily.    Marland Kitchen buPROPion (WELLBUTRIN XL) 150 MG 24 hr tablet Take 1 tablet (150 mg total) by mouth daily. Increase to 2 po qd after 1 week 60 tablet 1   No facility-administered medications prior to visit.     EXAM:  BP 150/68  mmHg  Temp(Src) 98.4 F (36.9 C) (Oral)  Wt 102 lb (46.267 kg)  Body mass index is 17.65 kg/(m^2).  GENERAL: vitals reviewed and listed above, alert, oriented, appears well hydrated and in no acute distress MS: moves all extremities without noticeable focal  Abnormality Skin under axilla  Round indistinct pathces scaling redness   Without vesicle or weeping  PSYCH: pleasant and cooperative,  Mod eye contact    ASSESSMENT AND PLAN:  Discussed the following assessment and plan:  Major depressive disorder, recurrent episode, mild - relationship effect  Elevated BP - up agina today ? stress may consder to go mback on med but  need more data points dont think wellbutrin effect significant  Contact dermatitis ? - axilla  try 1%hcs  Encounter for immunization  -Patient advised to return or notify health care team  if symptoms worsen ,persist or new concerns arise.  Patient Instructions  Increase  Dose of wellbutrin 24 hours  to 300 mg per day   Return office visit in 4-6 weeks     Continue counseling .  Take blood pressure readings twice a day  for 10-14  days and then periodically .Goal is   below 140/90   .Send in readings     Or review when you come back in to follow up visit .  Take readings after relaxed  And sitting for 3-5 minutes    Take 2-3 readings .  Discard the first reading.   How to Take Your Blood Pressure HOW DO I GET A BLOOD PRESSURE MACHINE?  You can buy an electronic home blood pressure machine at your local pharmacy. Insurance will sometimes cover the cost if you have a prescription.  Ask your doctor what type of machine is best for you. There are different machines for your arm and your wrist.  If you decide to buy a machine to check your blood pressure on your arm, first check the size of your arm so you can buy the right size cuff. To check the size of your arm:   Use a measuring tape that shows both inches and centimeters.   Wrap the measuring tape  around the upper-middle part of your arm. You may need someone to help you measure.   Write down your arm measurement in both inches and centimeters.   To measure your blood pressure correctly, it is important to have the right size cuff.   If your arm is up to 13 inches (up to 34 centimeters), get an adult cuff size.  If your arm is 13 to 17 inches (35 to 44 centimeters), get a large adult cuff size.    If your arm is 17 to 20 inches (45 to 52 centimeters), get an adult thigh cuff.  WHAT DO THE NUMBERS MEAN?   There are two numbers that make up your blood pressure. For example: 120/80.  The first number (120 in our example) is called the "systolic pressure." It is a measure of the pressure in your blood vessels when your heart is pumping blood.  The second number (80 in our example) is called the "diastolic pressure." It is a measure of the pressure in your blood vessels when your heart is resting between beats.  Your doctor will tell you what your blood pressure should be. WHAT SHOULD I DO BEFORE I CHECK MY BLOOD PRESSURE?   Try to rest or relax for at least 30 minutes before you check your blood pressure.  Do not smoke.  Do not have any drinks with caffeine, such as:  Soda.  Coffee.  Tea.  Check your blood pressure in a quiet room.  Sit down and stretch out your arm on a table. Keep your arm at about the level of your heart. Let your arm relax.  Make sure that your legs are not crossed. HOW DO I CHECK MY BLOOD PRESSURE?  Follow the directions that came with your machine.  Make sure you remove any tight-fighting clothing from your arm or wrist. Wrap the cuff around your upper arm or wrist. You should be able to fit a finger between the cuff and your arm. If you cannot fit a finger between the cuff and your arm, it is too tight and should be removed and rewrapped.  Some units require you to manually pump up the arm cuff.  Automatic units inflate the cuff when you  press a button.  Cuff deflation is automatic in both models.  After the cuff is inflated, the unit measures your blood pressure and pulse. The readings are shown on a monitor. Hold still and breathe normally while the cuff is  inflated.  Getting a reading takes less than a minute.  Some models store readings in a memory. Some provide a printout of readings. If your machine does not store your readings, keep a written record.  Take readings with you to your next visit with your doctor. Document Released: 10/13/2008 Document Revised: 03/17/2014 Document Reviewed: 12/26/2013 [facility] Patient Information 2015 [place], [place]. This information is not intended to replace advice given to you by your health care provider. Make sure you discuss any questions you have with your health care provider.        [name]. Panosh M.D.

## 2015-07-13 NOTE — Patient Instructions (Signed)
Increase  Dose of wellbutrin 24 hours  to 300 mg per day   Return office visit in 4-6 weeks     Continue counseling .  Take blood pressure readings twice a day for 10-14  days and then periodically .Goal is   below 140/90   .Send in readings     Or review when you come back in to follow up visit .  Take readings after relaxed  And sitting for 3-5 minutes    Take 2-3 readings .  Discard the first reading.   How to Take Your Blood Pressure HOW DO I GET A BLOOD PRESSURE MACHINE?  You can buy an electronic home blood pressure machine at your local pharmacy. Insurance will sometimes cover the cost if you have a prescription.  Ask your doctor what type of machine is best for you. There are different machines for your arm and your wrist.  If you decide to buy a machine to check your blood pressure on your arm, first check the size of your arm so you can buy the right size cuff. To check the size of your arm:   Use a measuring tape that shows both inches and centimeters.   Wrap the measuring tape around the upper-middle part of your arm. You may need someone to help you measure.   Write down your arm measurement in both inches and centimeters.   To measure your blood pressure correctly, it is important to have the right size cuff.   If your arm is up to 13 inches (up to 34 centimeters), get an adult cuff size.  If your arm is 13 to 17 inches (35 to 44 centimeters), get a large adult cuff size.    If your arm is 17 to 20 inches (45 to 52 centimeters), get an adult thigh cuff.  WHAT DO THE NUMBERS MEAN?   There are two numbers that make up your blood pressure. For example: 120/80.  The first number (120 in our example) is called the "systolic pressure." It is a measure of the pressure in your blood vessels when your heart is pumping blood.  The second number (80 in our example) is called the "diastolic pressure." It is a measure of the pressure in your blood vessels when your heart  is resting between beats.  Your doctor will tell you what your blood pressure should be. WHAT SHOULD I DO BEFORE I CHECK MY BLOOD PRESSURE?   Try to rest or relax for at least 30 minutes before you check your blood pressure.  Do not smoke.  Do not have any drinks with caffeine, such as:  Soda.  Coffee.  Tea.  Check your blood pressure in a quiet room.  Sit down and stretch out your arm on a table. Keep your arm at about the level of your heart. Let your arm relax.  Make sure that your legs are not crossed. HOW DO I CHECK MY BLOOD PRESSURE?  Follow the directions that came with your machine.  Make sure you remove any tight-fighting clothing from your arm or wrist. Wrap the cuff around your upper arm or wrist. You should be able to fit a finger between the cuff and your arm. If you cannot fit a finger between the cuff and your arm, it is too tight and should be removed and rewrapped.  Some units require you to manually pump up the arm cuff.  Automatic units inflate the cuff when you press a button.  Cuff deflation  is automatic in both models.  After the cuff is inflated, the unit measures your blood pressure and pulse. The readings are shown on a monitor. Hold still and breathe normally while the cuff is inflated.  Getting a reading takes less than a minute.  Some models store readings in a memory. Some provide a printout of readings. If your machine does not store your readings, keep a written record.  Take readings with you to your next visit with your doctor. Document Released: 10/13/2008 Document Revised: 03/17/2014 Document Reviewed: 12/26/2013 [facility] Patient Information 2015 [place], [place]. This information is not intended to replace advice given to you by your health care provider. Make sure you discuss any questions you have with your health care provider.

## 2015-07-21 ENCOUNTER — Telehealth: Payer: Self-pay | Admitting: Internal Medicine

## 2015-07-21 ENCOUNTER — Encounter: Payer: Self-pay | Admitting: Internal Medicine

## 2015-07-21 ENCOUNTER — Ambulatory Visit (INDEPENDENT_AMBULATORY_CARE_PROVIDER_SITE_OTHER): Payer: Medicare Other | Admitting: Internal Medicine

## 2015-07-21 VITALS — BP 164/90 | Temp 98.6°F | Ht 63.75 in | Wt 102.4 lb

## 2015-07-21 DIAGNOSIS — F330 Major depressive disorder, recurrent, mild: Secondary | ICD-10-CM

## 2015-07-21 DIAGNOSIS — I10 Essential (primary) hypertension: Secondary | ICD-10-CM

## 2015-07-21 DIAGNOSIS — R29818 Other symptoms and signs involving the nervous system: Secondary | ICD-10-CM

## 2015-07-21 DIAGNOSIS — R2689 Other abnormalities of gait and mobility: Secondary | ICD-10-CM

## 2015-07-21 MED ORDER — LISINOPRIL 10 MG PO TABS: 10.0000 mg | ORAL_TABLET | Freq: Every day | ORAL | Status: DC

## 2015-07-21 NOTE — Telephone Encounter (Signed)
Pt made appointment for today at 11:15 AM.

## 2015-07-21 NOTE — Progress Notes (Signed)
Pre visit review using our clinic review tool, if applicable. No additional management support is needed unless otherwise documented below in the visit note.   Chief Complaint  Patient presents with  . Hypertension    HPI: Patient Sheri Jensen  comes in today for SDA for  new problem evaluation. See phone notes about bp and se of meds . S Has readings wrist machine summer in the 1 9200 range initially got 150 range  ?   Balance  Problem  has had 2 times where she lost her balance Walking in hall and bumped into wall.   The lost balance in     bathroom this am and just standing arter urination.  No falling.  But  Regained  Without falling. No dizzy no vertigo .  No nausea vision changes Had 3 etoh yesterday . I'll a day weekend. Says has 2 glasses of wine a night generally. Denies any problems with this. ROS: See pertinent positives and negatives per HPI.  Past Medical History  Diagnosis Date  . Hyperlipidemia   . Hypertension   . Depression     Family History  Problem Relation Age of Onset  . Crohn's disease Daughter   . Cancer Maternal Grandfather     colon  . Diabetes Paternal Grandfather     Social History   Social History  . Marital Status: Married    Spouse Name: N/A  . Number of Children: N/A  . Years of Education: N/A   Social History Main Topics  . Smoking status: Never Smoker   . Smokeless tobacco: Never Used  . Alcohol Use: Yes     Comment: 2 glasses of wine daily   . Drug Use: No  . Sexual Activity: Not Asked   Other Topics Concern  . None   Social History Narrative   No tobacco    hh of 2   A cat    etoh 2-3 wine  per day    With meals    Married one son and one daughter   Exercise well.    Occupation:  Former Programmer, systems    Outpatient Prescriptions Prior to Visit  Medication Sig Dispense Refill  . buPROPion (WELLBUTRIN XL) 300 MG 24 hr tablet Take 1 tablet (300 mg total) by mouth daily. 30 tablet 2  . calcium-vitamin D (OSCAL) 250-125  MG-UNIT per tablet Take 1 tablet by mouth daily.    . Multiple Vitamins-Minerals (WOMENS ONE DAILY PO) Take by mouth daily.     No facility-administered medications prior to visit.   Wt Readings from Last 3 Encounters:  07/21/15 102 lb 6.4 oz (46.448 kg)  07/13/15 102 lb (46.267 kg)  06/11/15 104 lb (47.174 kg)     EXAM:  BP 164/90 mmHg  Temp(Src) 98.6 F (37 C) (Oral)  Ht 5' 3.75" (1.619 m)  Wt 102 lb 6.4 oz (46.448 kg)  BMI 17.72 kg/m2  Body mass index is 17.72 kg/(m^2).  GENERAL: vitals reviewed and listed above, alert, oriented, appears well hydrated and in no acute distress HEENT: atraumatic, conjunctiva  clear, no obvious abnormalities on inspection of external nose and ears NECK: no obvious masses on inspection palpation  LUNGS: clear to auscultation bilaterally, no wheezes, rales or rhonchi, good air movement CV: HRRR, no clubbing cyanosis or  peripheral edema nl cap refill  MS: moves all extremities without noticeable focal  abnormality PSYCH: pleasant and cooperative, no obvious depression or anxiety Neurologic exam cranial nerves III through XII appear intact  gait is within normal limits no motor deficits. Romberg negative good finger to nose and no tremor. Heel to toe walking is a little bit off but may be normal for her age group. She does not have a wide-based gait.  Blood pressure readings rechecked 150 05/[birth year]/1959 range from office machine cuff  her wrist machine multiple times were in the over 200 range with a diastolic in the 110 range. Same arm done at heart level. Lab Results  Component Value Date   WBC 6.2 09/12/2014   HGB 12.9 09/12/2014   HCT 39.5 09/12/2014   PLT 235.0 09/12/2014   GLUCOSE 103* 09/12/2014   CHOL 254* 09/12/2014   TRIG 90.0 09/12/2014   HDL 109.90 09/12/2014   LDLDIRECT 109.9 06/11/2013   LDLCALC 126* 09/12/2014   ALT 18 09/12/2014   AST 27 09/12/2014   NA 141 09/12/2014   K 4.7 09/12/2014   CL 101 09/12/2014   CREATININE 0.8  09/12/2014   BUN 20 09/12/2014   CO2 29 09/12/2014   TSH 1.55 09/12/2014    ASSESSMENT AND PLAN:  Discussed the following assessment and plan:  Essential hypertension - w possible aggravation but med uncertain if etoh effect also  hx of  low level meds in past  Balance problems - new onset  epidodic no loc or neruro sx  vertigo exam reassuring correl with medication but uncertainpt delcines aggressive wu at this time close fu warranted   Major depressive disorder, recurrent episode, mild Hypertension wrist machine is not accurate enough to make decisions. Off balance feelings episodes with no associated symptoms neurologic changes. Subtle finding heel-to-toe walking a little bit wide-based but probably better than most people her age. Discussed neurology consult and or MRI of the brain although no other focal findings. At this time she prefers going off medication treating blood pressure and see how she does. Advised her to initially stop alcohol for a month but she doesn't want to because she states it tastes so good with food but promises that she will cut back. Uncertain how much this is contributing to any of her symptoms. See below  -Patient advised to return or notify health care team  if symptoms worsen ,persist or new concerns arise.  Patient Instructions  I  don"t  think the wrist monitor is  Accurately.   Getting [place] bp readings  I would like you to get a  Arm monitor for this.   Begin low dose medication   Lisinopril  . Stay off the wellbutrin  For now .   Decrease the alcohol servings just in case adding to the problem.  Consider  mri of brain if continuing .   And/ or neurology consult.  ROV in  In 1 month  Keep that appt.        [name]. Santino Kinsella M.D.

## 2015-07-21 NOTE — Telephone Encounter (Signed)
Tried reaching the pt.  No answer and no machine.  Will try again at a later time.

## 2015-07-21 NOTE — Telephone Encounter (Signed)
Patient Name: Sheri Jensen  DOB: 1946/04/13    Initial Comment caller states BP 191/107, tightness in throat and loss of balance at times   Nurse Assessment  Nurse: Deatra James RN, Corrie Dandy Date/Time Lamount Cohen Time): 07/21/2015 8:29:21 AM  Confirm and document reason for call. If symptomatic, describe symptoms. ---Patient states BP 191/107, tightness in throat and loss of balance at times. She saw the doctor last week and was told to monitor her BP. She is taking (Welbutrin))and the doctor increased her dose since last Monday. The tightness in the throat is one of the side effects of this medicine  Has the patient traveled out of the country within the last 30 days? ---No  Does the patient require triage? ---Yes  Related visit to physician within the last 2 weeks? ---Yes  Does the PT have any chronic conditions? (i.e. diabetes, asthma, etc.) ---No     Guidelines    Guideline Title Affirmed Question Affirmed Notes  High Blood Pressure BP ? 180/110    Final Disposition User   See Physician within 24 Hours Noe, RN, Corrie Dandy    Comments  Attempted to make appt and unable due to blocked slots   Referrals  REFERRED TO PCP OFFICE   Disagree/Comply: Comply

## 2015-07-21 NOTE — Patient Instructions (Addendum)
I  don"t  think the wrist monitor is  Accurately.   Getting [place] bp readings  I would like you to get a  Arm monitor for this.   Begin low dose medication   Lisinopril  . Stay off the wellbutrin  For now .   Decrease the alcohol servings just in case adding to the problem.  Consider  mri of brain if continuing .   And/ or neurology consult.  ROV in  In 1 month  Keep that appt.

## 2015-08-17 ENCOUNTER — Ambulatory Visit (INDEPENDENT_AMBULATORY_CARE_PROVIDER_SITE_OTHER): Payer: Medicare Other | Admitting: Internal Medicine

## 2015-08-17 ENCOUNTER — Encounter: Payer: Self-pay | Admitting: Internal Medicine

## 2015-08-17 VITALS — BP 120/70 | Temp 98.0°F | Ht 63.75 in | Wt 102.4 lb

## 2015-08-17 DIAGNOSIS — I10 Essential (primary) hypertension: Secondary | ICD-10-CM | POA: Diagnosis not present

## 2015-08-17 DIAGNOSIS — R29818 Other symptoms and signs involving the nervous system: Secondary | ICD-10-CM | POA: Diagnosis not present

## 2015-08-17 DIAGNOSIS — R2689 Other abnormalities of gait and mobility: Secondary | ICD-10-CM

## 2015-08-17 DIAGNOSIS — F330 Major depressive disorder, recurrent, mild: Secondary | ICD-10-CM

## 2015-08-17 MED ORDER — BUPROPION HCL ER (XL) 150 MG PO TB24: 150.0000 mg | ORAL_TABLET | Freq: Every day | ORAL | Status: DC

## 2015-08-17 NOTE — Progress Notes (Signed)
Pre visit review using our clinic review tool, if applicable. No additional management support is needed unless otherwise documented below in the visit note.  Chief Complaint  Patient presents with  . Follow-up    HPI: Sheri GAVILANES  69 y.o. comes in for  Fu bp reading and meds   Hard to take bp cuff she has is regular  and needs 2 people  . Seems to ell better as far as balance . Pos slight mood continuing counseling .  No se of med   Otherwise   Had cuyt down to 1-2 etoh per evening  But not stopped  ROS: See pertinent positives and negatives per HPI.  Past Medical History  Diagnosis Date  . Hyperlipidemia   . Hypertension   . Depression     Family History  Problem Relation Age of Onset  . Crohn's disease Daughter   . Cancer Maternal Grandfather     colon  . Diabetes Paternal Grandfather     Social History   Social History  . Marital Status: Married    Spouse Name: N/A  . Number of Children: N/A  . Years of Education: N/A   Social History Main Topics  . Smoking status: Never Smoker   . Smokeless tobacco: Never Used  . Alcohol Use: Yes     Comment: 2 glasses of wine daily   . Drug Use: No  . Sexual Activity: Not Asked   Other Topics Concern  . None   Social History Narrative   No tobacco    hh of 2   A cat    etoh 2-3 wine  per day    With meals    Married one son and one daughter   Exercise well.    Occupation:  Former Programmer, systems    Outpatient Prescriptions Prior to Visit  Medication Sig Dispense Refill  . calcium-vitamin D (OSCAL) 250-125 MG-UNIT per tablet Take 1 tablet by mouth daily.    Marland Kitchen lisinopril (PRINIVIL,ZESTRIL) 10 MG tablet Take 1 tablet (10 mg total) by mouth daily. 30 tablet 3  . Multiple Vitamins-Minerals (WOMENS ONE DAILY PO) Take by mouth daily.    Marland Kitchen buPROPion (WELLBUTRIN XL) 300 MG 24 hr tablet Take 1 tablet (300 mg total) by mouth daily. 30 tablet 2   No facility-administered medications prior to visit.     EXAM:  BP  120/70 mmHg  Temp(Src) 98 F (36.7 C) (Oral)  Ht 5' 3.75" (1.619 m)  Wt 102 lb 6.4 oz (46.448 kg)  BMI 17.72 kg/m2  Body mass index is 17.72 kg/(m^2).  GENERAL: vitals reviewed and listed above, alert, oriented, appears well hydrated and in no acute distress HEENT: atraumatic, conjunctiva  clear, no obvious abnormalities on inspection of external nose and ears CV: HRRR, no clubbing cyanosis or  peripheral edema nl cap refill  MS: moves all extremities without noticeable focal  abnormality PSYCH: pleasant and cooperative, no obvious depression or anxiety looks more relaxed today  BP Readings from Last 3 Encounters:  08/17/15 120/70  07/21/15 164/90  07/13/15 150/68   Wt Readings from Last 3 Encounters:  08/17/15 102 lb 6.4 oz (46.448 kg)  07/21/15 102 lb 6.4 oz (46.448 kg)  07/13/15 102 lb (46.267 kg)    ASSESSMENT AND PLAN:  Discussed the following assessment and plan:  Essential hypertension - better off well and add med  try adding back low dose wellbutrin  Balance problems - resolved  Major depressive disorder, recurrent episode, mild (HCC) -  seems better   -Patient advised to return or notify health care team  if symptoms worsen ,persist or new concerns arise.  Patient Instructions  Continue limited   Alcohol as discussed .  Exercise can help mood.  Stay on BP  Medication .     Restart the low dose wellbutrin  Xl.  150 mg per day .  Preventive visit   With cpx labs in about 6 weeks    And med check at that time.   [name]. Rakeem Colley M.D.

## 2015-08-17 NOTE — Patient Instructions (Addendum)
Continue limited   Alcohol as discussed .  Exercise can help mood.  Stay on BP  Medication .     Restart the low dose wellbutrin  Xl.  150 mg per day .  Preventive visit   With cpx labs in about 6 weeks    And med check at that time.

## 2015-09-25 ENCOUNTER — Other Ambulatory Visit (INDEPENDENT_AMBULATORY_CARE_PROVIDER_SITE_OTHER): Payer: Medicare Other

## 2015-09-25 DIAGNOSIS — Z0000 Encounter for general adult medical examination without abnormal findings: Secondary | ICD-10-CM

## 2015-09-25 DIAGNOSIS — I10 Essential (primary) hypertension: Secondary | ICD-10-CM | POA: Diagnosis not present

## 2015-09-25 LAB — BASIC METABOLIC PANEL
BUN: 18 mg/dL (ref 6–23)
CALCIUM: 9.8 mg/dL (ref 8.4–10.5)
CO2: 30 mEq/L (ref 19–32)
Chloride: 105 mEq/L (ref 96–112)
Creatinine, Ser: 0.83 mg/dL (ref 0.40–1.20)
GFR: 72.72 mL/min (ref >60.00)
Glucose, Bld: 102 mg/dL — ABNORMAL HIGH (ref 70–99)
Potassium: 5 mEq/L (ref 3.5–5.1)
SODIUM: 142 meq/L (ref 135–145)

## 2015-09-25 LAB — TSH: TSH: 2.11 u[IU]/mL (ref 0.35–4.50)

## 2015-09-25 LAB — HEPATIC FUNCTION PANEL
ALBUMIN: 4.4 g/dL (ref 3.5–5.2)
ALK PHOS: 54 U/L (ref 39–117)
ALT: 13 U/L (ref 0–35)
AST: 18 U/L (ref 0–37)
BILIRUBIN DIRECT: 0.1 mg/dL (ref 0.0–0.3)
TOTAL PROTEIN: 7.1 g/dL (ref 6.0–8.3)
Total Bilirubin: 0.7 mg/dL (ref 0.2–1.2)

## 2015-09-25 LAB — CBC WITH DIFFERENTIAL/PLATELET
BASOS ABS: 0 10*3/uL (ref 0.0–0.1)
Basophils Relative: 0.6 % (ref 0.0–3.0)
Eosinophils Absolute: 0.3 10*3/uL (ref 0.0–0.7)
Eosinophils Relative: 5.7 % — ABNORMAL HIGH (ref 0.0–5.0)
HEMATOCRIT: 37.2 % (ref 36.0–46.0)
Hemoglobin: 12.3 g/dL (ref 12.0–15.0)
LYMPHS PCT: 33.9 % (ref 12.0–46.0)
Lymphs Abs: 1.7 10*3/uL (ref 0.7–4.0)
MCHC: 33.2 g/dL (ref 30.0–36.0)
MCV: 97.4 fl (ref 78.0–100.0)
MONOS PCT: 8.4 % (ref 3.0–12.0)
Monocytes Absolute: 0.4 10*3/uL (ref 0.1–1.0)
Neutro Abs: 2.6 10*3/uL (ref 1.4–7.7)
Neutrophils Relative %: 51.4 % (ref 43.0–77.0)
Platelets: 256 10*3/uL (ref 150.0–400.0)
RBC: 3.82 Mil/uL — AB (ref 3.87–5.11)
RDW: 13.6 % (ref 11.5–15.5)
WBC: 5 10*3/uL (ref 4.0–10.5)

## 2015-09-25 LAB — LIPID PANEL
CHOL/HDL RATIO: 2
Cholesterol: 211 mg/dL — ABNORMAL HIGH (ref 0–200)
HDL: 85.8 mg/dL (ref >39.00)
LDL CALC: 113 mg/dL — AB (ref 0–99)
NONHDL: 124.8
Triglycerides: 60 mg/dL (ref 0.0–149.0)
VLDL: 12 mg/dL (ref 0.0–40.0)

## 2015-10-01 ENCOUNTER — Ambulatory Visit (INDEPENDENT_AMBULATORY_CARE_PROVIDER_SITE_OTHER): Payer: Medicare Other | Admitting: Internal Medicine

## 2015-10-01 ENCOUNTER — Encounter: Payer: Self-pay | Admitting: Internal Medicine

## 2015-10-01 VITALS — BP 150/82 | Temp 98.2°F | Ht 64.0 in | Wt 103.2 lb

## 2015-10-01 DIAGNOSIS — Z0000 Encounter for general adult medical examination without abnormal findings: Secondary | ICD-10-CM

## 2015-10-01 DIAGNOSIS — F325 Major depressive disorder, single episode, in full remission: Secondary | ICD-10-CM | POA: Diagnosis not present

## 2015-10-01 DIAGNOSIS — Z79899 Other long term (current) drug therapy: Secondary | ICD-10-CM

## 2015-10-01 DIAGNOSIS — I10 Essential (primary) hypertension: Secondary | ICD-10-CM | POA: Diagnosis not present

## 2015-10-01 DIAGNOSIS — F4323 Adjustment disorder with mixed anxiety and depressed mood: Secondary | ICD-10-CM

## 2015-10-01 MED ORDER — BUSPIRONE HCL 10 MG PO TABS: 5.0000 mg | ORAL_TABLET | Freq: Two times a day (BID) | ORAL | Status: DC

## 2015-10-01 NOTE — Progress Notes (Signed)
Pre visit review using our clinic review tool, if applicable. No additional management support is needed unless otherwise documented below in the visit note.  Chief Complaint  Patient presents with  . Medicare Wellness    HPI: Sheri Jensen 69 y.o. comes in today for Preventive Medicare wellness visit . And problem management . Anxiety today   Family news .   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 [place]  Therapist   Advice anxiety medication   Health Maintenance  Topic Date Due  . INFLUENZA VACCINE  06/14/2016  . MAMMOGRAM  02/10/2017  . COLONOSCOPY  12/30/2018  . TETANUS/TDAP  08/10/2020  . DEXA SCAN  Completed  . ZOSTAVAX  Addressed  . Hepatitis C Screening  Completed  . PNA vac Low Risk Adult  Completed   Health Maintenance Review LIFESTYLE:  TAD   Neg to d pos etoh has cut down  Sugar beverages:n Sleep:y    MEDICARE DOCUMENT QUESTIONS  TO SCAN     Hearing: ok  Vision:  No limitations at present . Last eye check UTD  Safety:  Has smoke detector and wears seat belts.  No firearms. No excess sun exposure. Sees dentist regularly.  Falls:   Advance directive :  Reviewed  Has  Information just hasnt gotten to it   Memory: Felt to be good  , no concern from her or her family.  Depression: No anhedonia unusual crying or depressive symptoms see above about counselor   Nutrition: Eats well balanced diet; adequate calcium and vitamin D. No swallowing chewing problems.  Injury: no major injuries in the last six months.  Other healthcare providers:  Reviewed today .  Social:  Lives with spouse married.   helpw with grand kids   Preventive parameters: up-to-date  Reviewed   ADLS:   There are no problems or need for assistance  driving, feeding, obtaining food, dressing, toileting and bathing, managing money using phone. She is independent.    ROS:  GEN/ HEENT: No fever, significant  weight changes sweats headaches vision problems hearing changes, CV/ PULM; No chest pain shortness of breath cough, syncope,edema  change in exercise tolerance. GI /GU: No adominal pain, vomiting, change in bowel habits. No blood in the stool. No significant GU symptoms. SKIN/HEME: ,no acute skin rashes suspicious lesions or bleeding. No lymphadenopathy, nodules, masses.  NEURO/ PSYCH:  No neurologic signs such as weakness numbness. No depression anxiety. IMM/ Allergy: No unusual infections.  Allergy .   REST of 12 system review negative except as per HPI   Past Medical History  Diagnosis Date  . Hyperlipidemia   . Hypertension   . Depression     Family History  Problem Relation Age of Onset  . Crohn's disease Daughter   . Cancer Maternal Grandfather     colon  . Diabetes Paternal Grandfather   . Deafness Grandchild     congenital    Social History   Social History  . Marital Status: Married    Spouse Name: N/A  . Number of Children: N/A  . Years of Education: N/A   Social History Main Topics  . Smoking status: Never Smoker   . Smokeless tobacco: Never Used  . Alcohol Use: Yes     Comment: 2 glasses of wine daily   . Drug Use: No  . Sexual Activity: Not Asked   Other Topics  Concern  . None   Social History Narrative   No tobacco    hh of 2   A cat    etoh 2-3 wine  per day    With meals    Married one son and one daughter   Exercise well.    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 buPROPion (WELLBUTRIN XL) 150 MG 24 hr tablet Take 1 tablet (150 mg total) by mouth daily.  . calcium-vitamin D (OSCAL) 250-125 MG-UNIT per tablet Take 1 tablet by mouth daily.  Marland Kitchen lisinopril (PRINIVIL,ZESTRIL) 10 MG tablet Take 1 tablet (10 mg total) by mouth daily.  . Multiple Vitamins-Minerals (WOMENS ONE DAILY PO) Take by mouth daily.  . busPIRone (BUSPAR) 10 MG tablet Take 0.5 tablets (5 mg total) by mouth 2 (two)  times daily. Increase to 10 mg twice a day over 2-3 weeks or as directed  . [DISCONTINUED] busPIRone (BUSPAR) 10 MG tablet Take 0.5 tablets (5 mg total) by mouth 2 (two) times daily. Increase to 10 mg twice a day over 2-3 weeks or as directed   No facility-administered encounter medications on file as of 10/01/2015.    EXAM:  BP 150/82 mmHg  Temp(Src) 98.2 F (36.8 C) (Oral)  Ht 5' 4"  (1.626 m)  Wt 103 lb 3.2 oz (46.811 kg)  BMI 17.71 kg/m2  Body mass index is 17.71 kg/(m^2).  Physical Exam: Vital signs reviewed BBC:WUGQ is a well-developed well-nourished alert cooperative   who appears stated age in no acute distress.  HEENT: normocephalic atraumatic , Eyes: PERRL EOM's full, conjunctiva clear, Nares: paten,t no deformity discharge or tenderness., Ears: no deformity EAC's clear TMs with normal landmarks. Mouth: clear OP, no lesions, edema.  Moist mucous membranes. Dentition in adequate repair. NECK: supple without masses, thyromegaly or bruits. CHEST/PULM:  Clear to auscultation and percussion breath sounds equal no wheeze , rales or rhonchi. No chest wall deformities or tenderness. CV: PMI is nondisplaced, S1 S2 no gallops, murmurs, rubs. Peripheral pulses are full without delay.No JVD . Breast: normal by inspection . No dimpling, discharge, masses, tenderness or discharge . ABDOMEN: Bowel sounds normal nontender  No guard or rebound, no hepato splenomegal no CVA tenderness.   Extremtities:  No clubbing cyanosis or edema, no acute joint swelling or redness no focal atrophy NEURO:  Oriented x3, cranial nerves 3-12 appear to be intact, no obvious focal weakness,gait within normal limits no abnormal reflexes or asymmetrical SKIN: No acute rashes normal turgor, color, no bruising or petechiae. PSYCH: Oriented, good eye contact, no obvious depression anxiety, cognition and judgment appear normal. LN: no cervical axillary inguinal adenopathy No noted deficits in memory, attention, and  speech.   Lab Results  Component Value Date   WBC 5.0 09/25/2015   HGB 12.3 09/25/2015   HCT 37.2 09/25/2015   PLT 256.0 09/25/2015   GLUCOSE 102* 09/25/2015   CHOL 211* 09/25/2015   TRIG 60.0 09/25/2015   HDL 85.80 09/25/2015   LDLDIRECT 109.9 06/11/2013   LDLCALC 113* 09/25/2015   ALT 13 09/25/2015   AST 18 09/25/2015   NA 142 09/25/2015   K 5.0 09/25/2015   CL 105 09/25/2015   CREATININE 0.83 09/25/2015   BUN 18 09/25/2015   CO2 30 09/25/2015   TSH 2.11 09/25/2015   BP Readings from Last 3 Encounters:  10/01/15 150/82  08/17/15 120/70  07/21/15 164/90   Wt Readings from Last 3 Encounters:  10/01/15 103  lb 3.2 oz (46.811 kg)  08/17/15 102 lb 6.4 oz (46.448 kg)  07/21/15 102 lb 6.4 oz (46.448 kg)    ASSESSMENT AND PLAN:  Discussed the following assessment and plan:  Visit for preventive health examination  Medicare annual wellness visit, subsequent  Essential hypertension  Medication management  Adjustment reaction with anxiety and depression - add buspar to wellbutrin  see text  Get back on bp med . Add on buspar 5 bid nad inc to 10 bid rov in 203 months prefer this to ssri at this time  Cause although not in record think she had a ssri years ago and may have had se ??  Not in epic and she doesn't remember this  consider low dose ssri  If needed  Continue limited etoh.  Patient Care Team: Burnis Medin, MD as PCP - General  Patient Instructions  Get back on the lisinopril Stay on the wellbutrin and add buspar   5 mg twice a day and increase as tolerated .  Sometimes low dose is just enough .   Plan ROV in 2-3 months or as needed.  Continue caution with alcohol adding to anxiety .    Health Maintenance, Female Adopting a healthy lifestyle and getting preventive care can go a long way to promote health and wellness. Talk with your health care provider about what schedule of regular examinations is right for you. This is a good chance for you to check  in with your provider about disease prevention and staying healthy. In between checkups, there are plenty of things you can do on your own. Experts have done a lot of research about which lifestyle changes and preventive measures are most likely to keep you healthy. Ask your health care provider for more information. WEIGHT AND DIET  Eat a healthy diet  Be sure to include plenty of vegetables, fruits, low-fat dairy products, and lean protein.  Do not eat a lot of foods high in solid fats, added sugars, or salt.  Get regular exercise. This is one of the most important things you can do for your health.  Most adults should exercise for at least 150 minutes each week. The exercise should increase your heart rate and make you sweat (moderate-intensity exercise).  Most adults should also do strengthening exercises at least twice a week. This is in addition to the moderate-intensity exercise.  Maintain a healthy weight  Body mass index (BMI) is a measurement that can be used to identify possible weight problems. It estimates body fat based on height and weight. Your health care provider can help determine your BMI and help you achieve or maintain a healthy weight.  For females 48 years of age and older:   A BMI below 18.5 is considered underweight.  A BMI of 18.5 to 24.9 is normal.  A BMI of 25 to 29.9 is considered overweight.  A BMI of 30 and above is considered obese.  Watch levels of cholesterol and blood lipids  You should start having your blood tested for lipids and cholesterol at 67 years of age, then have this test every 5 years.  You may need to have your cholesterol levels checked more often if:  Your lipid or cholesterol levels are high.  You are older than 67 years of age.  You are at high risk for heart disease.  CANCER SCREENING   Lung Cancer  Lung cancer screening is recommended for adults 55-33 years old who are at high risk for lung  cancer because of a  history of smoking.  A yearly low-dose CT scan of the lungs is recommended for people who:  Currently smoke.  Have quit within the past 15 years.  Have at least a 30-pack-year history of smoking. A pack year is smoking an average of one pack of cigarettes a day for 1 year.  Yearly screening should continue until it has been 15 years since you quit.  Yearly screening should stop if you develop a health problem that would prevent you from having lung cancer treatment.  Breast Cancer  Practice breast self-awareness. This means understanding how your breasts normally appear and feel.  It also means doing regular breast self-exams. Let your health care provider know about any changes, no matter how small.  If you are in your 20s or 30s, you should have a clinical breast exam (CBE) by a health care provider every 1-3 years as part of a regular health exam.  If you are 3 or older, have a CBE every year. Also consider having a breast X-ray (mammogram) every year.  If you have a family history of breast cancer, talk to your health care provider about genetic screening.  If you are at high risk for breast cancer, talk to your health care provider about having an MRI and a mammogram every year.  Breast cancer gene (BRCA) assessment is recommended for women who have family members with BRCA-related cancers. BRCA-related cancers include:  Breast.  Ovarian.  Tubal.  Peritoneal cancers.  Results of the assessment will determine the need for genetic counseling and BRCA1 and BRCA2 testing. Cervical Cancer Your health care provider may recommend that you be screened regularly for cancer of the pelvic organs (ovaries, uterus, and vagina). This screening involves a pelvic examination, including checking for microscopic changes to the surface of your cervix (Pap test). You may be encouraged to have this screening done every 3 years, beginning at age 81.  For women ages 15-65, health care  providers may recommend pelvic exams and Pap testing every 3 years, or they may recommend the Pap and pelvic exam, combined with testing for human papilloma virus (HPV), every 5 years. Some types of HPV increase your risk of cervical cancer. Testing for HPV may also be done on women of any age with unclear Pap test results.  Other health care providers may not recommend any screening for nonpregnant women who are considered low risk for pelvic cancer and who do not have symptoms. Ask your health care provider if a screening pelvic exam is right for you.  If you have had past treatment for cervical cancer or a condition that could lead to cancer, you need Pap tests and screening for cancer for at least 20 years after your treatment. If Pap tests have been discontinued, your risk factors (such as having a new sexual partner) need to be reassessed to determine if screening should resume. Some women have medical problems that increase the chance of getting cervical cancer. In these cases, your health care provider may recommend more frequent screening and Pap tests. Colorectal Cancer  This type of cancer can be detected and often prevented.  Routine colorectal cancer screening usually begins at 67 years of age and continues through 67 years of age.  Your health care provider may recommend screening at an earlier age if you have risk factors for colon cancer.  Your health care provider may also recommend using home test kits to check for hidden blood in the stool.  A small camera at the end of a tube can be used to examine your colon directly (sigmoidoscopy or colonoscopy). This is done to check for the earliest forms of colorectal cancer.  Routine screening usually begins at age 18.  Direct examination of the colon should be repeated every 5-10 years through 67 years of age. However, you may need to be screened more often if early forms of precancerous polyps or small growths are found. Skin  Cancer  Check your skin from head to toe regularly.  Tell your health care provider about any new moles or changes in moles, especially if there is a change in a mole's shape or color.  Also tell your health care provider if you have a mole that is larger than the size of a pencil eraser.  Always use sunscreen. Apply sunscreen liberally and repeatedly throughout the day.  Protect yourself by wearing long sleeves, pants, a wide-brimmed hat, and sunglasses whenever you are outside. HEART DISEASE, DIABETES, AND HIGH BLOOD PRESSURE   High blood pressure causes heart disease and increases the risk of stroke. High blood pressure is more likely to develop in:  People who have blood pressure in the high end of the normal range (130-139/85-89 mm Hg).  People who are overweight or obese.  People who are African American.  If you are 38-44 years of age, have your blood pressure checked every 3-5 years. If you are 88 years of age or older, have your blood pressure checked every year. You should have your blood pressure measured twice--once when you are at a hospital or clinic, and once when you are not at a hospital or clinic. Record the average of the two measurements. To check your blood pressure when you are not at a hospital or clinic, you can use:  An automated blood pressure machine at a pharmacy.  A home blood pressure monitor.  If you are between 32 years and 41 years old, ask your health care provider if you should take aspirin to prevent strokes.  Have regular diabetes screenings. This involves taking a blood sample to check your fasting blood sugar level.  If you are at a normal weight and have a low risk for diabetes, have this test once every three years after 67 years of age.  If you are overweight and have a high risk for diabetes, consider being tested at a younger age or more often. PREVENTING INFECTION  Hepatitis B  If you have a higher risk for hepatitis B, you should be  screened for this virus. You are considered at high risk for hepatitis B if:  You were born in a country where hepatitis B is common. Ask your health care provider which countries are considered high risk.  Your parents were born in a high-risk country, and you have not been immunized against hepatitis B (hepatitis B vaccine).  You have HIV or AIDS.  You use needles to inject street drugs.  You live with someone who has hepatitis B.  You have had sex with someone who has hepatitis B.  You get hemodialysis treatment.  You take certain medicines for conditions, including cancer, organ transplantation, and autoimmune conditions. Hepatitis C  Blood testing is recommended for:  Everyone born from [birth year] through 1963.  Anyone with known risk factors for hepatitis C. Sexually transmitted infections (STIs)  You should be screened for sexually transmitted infections (STIs) including gonorrhea and chlamydia if:  You are sexually active and are younger than 67 years of  age.  Dennis Bast are older than 67 years of age and your health care provider tells you that you are at risk for this type of infection.  Your sexual activity has changed since you were last screened and you are at an increased risk for chlamydia or gonorrhea. Ask your health care provider if you are at risk.  If you do not have HIV, but are at risk, it may be recommended that you take a prescription medicine daily to prevent HIV infection. This is called pre-exposure prophylaxis (PrEP). You are considered at risk if:  You are sexually active and do not regularly use condoms or know the HIV status of your partner(s).  You take drugs by injection.  You are sexually active with a partner who has HIV. Talk with your health care provider about whether you are at high risk of being infected with HIV. If you choose to begin PrEP, you should first be tested for HIV. You should then be tested every 3 months for as long as you are taking  PrEP.  PREGNANCY   If you are premenopausal and you may become pregnant, ask your health care provider about preconception counseling.  If you may become pregnant, take 400 to 800 micrograms (mcg) of folic acid every day.  If you want to prevent pregnancy, talk to your health care provider about birth control (contraception). OSTEOPOROSIS AND MENOPAUSE   Osteoporosis is a disease in which the bones lose minerals and strength with aging. This can result in serious bone fractures. Your risk for osteoporosis can be identified using a bone density scan.  If you are 80 years of age or older, or if you are at risk for osteoporosis and fractures, ask your health care provider if you should be screened.  Ask your health care provider whether you should take a calcium or vitamin D supplement to lower your risk for osteoporosis.  Menopause may have certain physical symptoms and risks.  Hormone replacement therapy may reduce some of these symptoms and risks. Talk to your health care provider about whether hormone replacement therapy is right for you.  HOME CARE INSTRUCTIONS   Schedule regular health, dental, and eye exams.  Stay current with your immunizations.   Do not use any tobacco products including cigarettes, chewing tobacco, or electronic cigarettes.  If you are pregnant, do not drink alcohol.  If you are breastfeeding, limit how much and how often you drink alcohol.  Limit alcohol intake to no more than 1 drink per day for nonpregnant women. One drink equals 12 ounces of beer, 5 ounces of wine, or 1 ounces of hard liquor.  Do not use street drugs.  Do not share needles.  Ask your health care provider for help if you need support or information about quitting drugs.  Tell your health care provider if you often feel depressed.  Tell your health care provider if you have ever been abused or do not feel safe at home.   This information is not intended to replace advice given  to you by your health care provider. Make sure you discuss any questions you have with your health care provider.   Document Released: 05/16/2011 Document Revised: 11/21/2014 Document Reviewed: 10/02/2013 Elsevier Interactive Patient Education 2016 [place]. Destyn Schuyler M.D.

## 2015-10-01 NOTE — Patient Instructions (Addendum)
Get back on the lisinopril Stay on the wellbutrin and add buspar   5 mg twice a day and increase as tolerated .  Sometimes low dose is just enough .   Plan ROV in 2-3 months or as needed.  Continue caution with alcohol adding to anxiety .    Health Maintenance, Female Adopting a healthy lifestyle and getting preventive care can go a long way to promote health and wellness. Talk with your health care provider about what schedule of regular examinations is right for you. This is a good chance for you to check in with your provider about disease prevention and staying healthy. In between checkups, there are plenty of things you can do on your own. Experts have done a lot of research about which lifestyle changes and preventive measures are most likely to keep you healthy. Ask your health care provider for more information. WEIGHT AND DIET  Eat a healthy diet  Be sure to include plenty of vegetables, fruits, low-fat dairy products, and lean protein.  Do not eat a lot of foods high in solid fats, added sugars, or salt.  Get regular exercise. This is one of the most important things you can do for your health.  Most adults should exercise for at least 150 minutes each week. The exercise should increase your heart rate and make you sweat (moderate-intensity exercise).  Most adults should also do strengthening exercises at least twice a week. This is in addition to the moderate-intensity exercise.  Maintain a healthy weight  Body mass index (BMI) is a measurement that can be used to identify possible weight problems. It estimates body fat based on height and weight. Your health care provider can help determine your BMI and help you achieve or maintain a healthy weight.  For females 67 years of age and older:   A BMI below 18.5 is considered underweight.  A BMI of 18.5 to 24.9 is normal.  A BMI of 25 to 29.9 is considered overweight.  A BMI of 30 and above is considered obese.  Watch  levels of cholesterol and blood lipids  You should start having your blood tested for lipids and cholesterol at 67 years of age, then have this test every 5 years.  You may need to have your cholesterol levels checked more often if:  Your lipid or cholesterol levels are high.  You are older than 67 years of age.  You are at high risk for heart disease.  CANCER SCREENING   Lung Cancer  Lung cancer screening is recommended for adults 64-37 years old who are at high risk for lung cancer because of a history of smoking.  A yearly low-dose CT scan of the lungs is recommended for people who:  Currently smoke.  Have quit within the past 15 years.  Have at least a 30-pack-year history of smoking. A pack year is smoking an average of one pack of cigarettes a day for 1 year.  Yearly screening should continue until it has been 15 years since you quit.  Yearly screening should stop if you develop a health problem that would prevent you from having lung cancer treatment.  Breast Cancer  Practice breast self-awareness. This means understanding how your breasts normally appear and feel.  It also means doing regular breast self-exams. Let your health care provider know about any changes, no matter how small.  If you are in your 20s or 30s, you should have a clinical breast exam (CBE) by a health care  provider every 1-3 years as part of a regular health exam.  If you are 3 or older, have a CBE every year. Also consider having a breast X-ray (mammogram) every year.  If you have a family history of breast cancer, talk to your health care provider about genetic screening.  If you are at high risk for breast cancer, talk to your health care provider about having an MRI and a mammogram every year.  Breast cancer gene (BRCA) assessment is recommended for women who have family members with BRCA-related cancers. BRCA-related cancers include:  Breast.  Ovarian.  Tubal.  Peritoneal  cancers.  Results of the assessment will determine the need for genetic counseling and BRCA1 and BRCA2 testing. Cervical Cancer Your health care provider may recommend that you be screened regularly for cancer of the pelvic organs (ovaries, uterus, and vagina). This screening involves a pelvic examination, including checking for microscopic changes to the surface of your cervix (Pap test). You may be encouraged to have this screening done every 3 years, beginning at age 41.  For women ages 67-65, health care providers may recommend pelvic exams and Pap testing every 3 years, or they may recommend the Pap and pelvic exam, combined with testing for human papilloma virus (HPV), every 5 years. Some types of HPV increase your risk of cervical cancer. Testing for HPV may also be done on women of any age with unclear Pap test results.  Other health care providers may not recommend any screening for nonpregnant women who are considered low risk for pelvic cancer and who do not have symptoms. Ask your health care provider if a screening pelvic exam is right for you.  If you have had past treatment for cervical cancer or a condition that could lead to cancer, you need Pap tests and screening for cancer for at least 20 years after your treatment. If Pap tests have been discontinued, your risk factors (such as having a new sexual partner) need to be reassessed to determine if screening should resume. Some women have medical problems that increase the chance of getting cervical cancer. In these cases, your health care provider may recommend more frequent screening and Pap tests. Colorectal Cancer  This type of cancer can be detected and often prevented.  Routine colorectal cancer screening usually begins at 67 years of age and continues through 67 years of age.  Your health care provider may recommend screening at an earlier age if you have risk factors for colon cancer.  Your health care provider may also  recommend using home test kits to check for hidden blood in the stool.  A small camera at the end of a tube can be used to examine your colon directly (sigmoidoscopy or colonoscopy). This is done to check for the earliest forms of colorectal cancer.  Routine screening usually begins at age 62.  Direct examination of the colon should be repeated every 5-10 years through 67 years of age. However, you may need to be screened more often if early forms of precancerous polyps or small growths are found. Skin Cancer  Check your skin from head to toe regularly.  Tell your health care provider about any new moles or changes in moles, especially if there is a change in a mole's shape or color.  Also tell your health care provider if you have a mole that is larger than the size of a pencil eraser.  Always use sunscreen. Apply sunscreen liberally and repeatedly throughout the day.  Protect yourself  by wearing long sleeves, pants, a wide-brimmed hat, and sunglasses whenever you are outside. HEART DISEASE, DIABETES, AND HIGH BLOOD PRESSURE   High blood pressure causes heart disease and increases the risk of stroke. High blood pressure is more likely to develop in:  People who have blood pressure in the high end of the normal range (130-139/85-89 mm Hg).  People who are overweight or obese.  People who are African American.  If you are 31-89 years of age, have your blood pressure checked every 3-5 years. If you are 68 years of age or older, have your blood pressure checked every year. You should have your blood pressure measured twice--once when you are at a hospital or clinic, and once when you are not at a hospital or clinic. Record the average of the two measurements. To check your blood pressure when you are not at a hospital or clinic, you can use:  An automated blood pressure machine at a pharmacy.  A home blood pressure monitor.  If you are between 55 years and 43 years old, ask your health  care provider if you should take aspirin to prevent strokes.  Have regular diabetes screenings. This involves taking a blood sample to check your fasting blood sugar level.  If you are at a normal weight and have a low risk for diabetes, have this test once every three years after 67 years of age.  If you are overweight and have a high risk for diabetes, consider being tested at a younger age or more often. PREVENTING INFECTION  Hepatitis B  If you have a higher risk for hepatitis B, you should be screened for this virus. You are considered at high risk for hepatitis B if:  You were born in a country where hepatitis B is common. Ask your health care provider which countries are considered high risk.  Your parents were born in a high-risk country, and you have not been immunized against hepatitis B (hepatitis B vaccine).  You have HIV or AIDS.  You use needles to inject street drugs.  You live with someone who has hepatitis B.  You have had sex with someone who has hepatitis B.  You get hemodialysis treatment.  You take certain medicines for conditions, including cancer, organ transplantation, and autoimmune conditions. Hepatitis C  Blood testing is recommended for:  Everyone born from [birth year] through 1963.  Anyone with known risk factors for hepatitis C. Sexually transmitted infections (STIs)  You should be screened for sexually transmitted infections (STIs) including gonorrhea and chlamydia if:  You are sexually active and are younger than 67 years of age.  You are older than 67 years of age and your health care provider tells you that you are at risk for this type of infection.  Your sexual activity has changed since you were last screened and you are at an increased risk for chlamydia or gonorrhea. Ask your health care provider if you are at risk.  If you do not have HIV, but are at risk, it may be recommended that you take a prescription medicine daily to prevent HIV  infection. This is called pre-exposure prophylaxis (PrEP). You are considered at risk if:  You are sexually active and do not regularly use condoms or know the HIV status of your partner(s).  You take drugs by injection.  You are sexually active with a partner who has HIV. Talk with your health care provider about whether you are at high risk of being infected with HIV.  If you choose to begin PrEP, you should first be tested for HIV. You should then be tested every 3 months for as long as you are taking PrEP.  PREGNANCY   If you are premenopausal and you may become pregnant, ask your health care provider about preconception counseling.  If you may become pregnant, take 400 to 800 micrograms (mcg) of folic acid every day.  If you want to prevent pregnancy, talk to your health care provider about birth control (contraception). OSTEOPOROSIS AND MENOPAUSE   Osteoporosis is a disease in which the bones lose minerals and strength with aging. This can result in serious bone fractures. Your risk for osteoporosis can be identified using a bone density scan.  If you are 53 years of age or older, or if you are at risk for osteoporosis and fractures, ask your health care provider if you should be screened.  Ask your health care provider whether you should take a calcium or vitamin D supplement to lower your risk for osteoporosis.  Menopause may have certain physical symptoms and risks.  Hormone replacement therapy may reduce some of these symptoms and risks. Talk to your health care provider about whether hormone replacement therapy is right for you.  HOME CARE INSTRUCTIONS   Schedule regular health, dental, and eye exams.  Stay current with your immunizations.   Do not use any tobacco products including cigarettes, chewing tobacco, or electronic cigarettes.  If you are pregnant, do not drink alcohol.  If you are breastfeeding, limit how much and how often you drink alcohol.  Limit  alcohol intake to no more than 1 drink per day for nonpregnant women. One drink equals 12 ounces of beer, 5 ounces of wine, or 1 ounces of hard liquor.  Do not use street drugs.  Do not share needles.  Ask your health care provider for help if you need support or information about quitting drugs.  Tell your health care provider if you often feel depressed.  Tell your health care provider if you have ever been abused or do not feel safe at home.   This information is not intended to replace advice given to you by your health care provider. Make sure you discuss any questions you have with your health care provider.   Document Released: 05/16/2011 Document Revised: 11/21/2014 Document Reviewed: 10/02/2013 Elsevier Interactive Patient Education [employer].

## 2015-11-27 ENCOUNTER — Other Ambulatory Visit: Payer: Self-pay | Admitting: Internal Medicine

## 2015-11-27 NOTE — Telephone Encounter (Signed)
Sent to the pharmacy by e-scribe. 

## 2015-12-24 ENCOUNTER — Telehealth: Payer: Self-pay | Admitting: Internal Medicine

## 2015-12-24 NOTE — Telephone Encounter (Signed)
FYI - Patient has appt tomorrow with Dr. Fabian Sharp

## 2015-12-24 NOTE — Telephone Encounter (Signed)
Patient Name: Sheri Jensen DOB: 1946-10-06 Initial Comment Caller states she's had an upset stomach for three days. No fever, no diarrhea, no headache. Just an upset stomach to the point where she doesn't want to move. Nurse Assessment Nurse: Charna Elizabeth, RN, Cathy Date/Time (Eastern Time): 12/24/2015 12:07:02 PM Confirm and document reason for call. If symptomatic, describe symptoms. You must click the next button to save text entered. ---Caller states she developed vomiting and an upset stomach 3 days ago. She vomited once. No severe breathing difficulty. No abdominal pain. No chest pain. No diarrhea. Alert and responsive. Has the patient traveled out of the country within the last 30 days? ---No Does the patient have any new or worsening symptoms? ---Yes Will a triage be completed? ---Yes Related visit to physician within the last 2 weeks? ---No Does the PT have any chronic conditions? (i.e. diabetes, asthma, etc.) ---Yes List chronic conditions. ---High Blood Pressure Is this a behavioral health or substance abuse call? ---No Guidelines Guideline Title Affirmed Question Affirmed Notes Vomiting [1] MILD or MODERATE vomiting AND [2] present > 48 hours (2 days) (Exception: mild vomiting with associated diarrhea) Chest Pain [1] Chest pain lasting <= 5 minutes AND [2] NO chest pain or cardiac symptoms now (Exceptions: pains lasting a few seconds) Final Disposition User See Physician within 24 Hours [place], RN, Hospital doctor states that 2 nights ago she had an episode of chest pain (rated as a 6 on the 1 to 10 scale) that lasted about a minute. Referrals REFERRED TO PCP OFFICE Disagree/Comply: Comply Disagree/Comply: Comply

## 2015-12-24 NOTE — Telephone Encounter (Signed)
Noted. Appt scheduled. 

## 2015-12-25 ENCOUNTER — Encounter: Payer: Self-pay | Admitting: Internal Medicine

## 2015-12-25 ENCOUNTER — Ambulatory Visit (INDEPENDENT_AMBULATORY_CARE_PROVIDER_SITE_OTHER): Payer: Medicare Other | Admitting: Internal Medicine

## 2015-12-25 VITALS — BP 110/74 | Temp 97.8°F | Ht 64.0 in | Wt 97.9 lb

## 2015-12-25 DIAGNOSIS — R0789 Other chest pain: Secondary | ICD-10-CM | POA: Diagnosis not present

## 2015-12-25 DIAGNOSIS — R112 Nausea with vomiting, unspecified: Secondary | ICD-10-CM

## 2015-12-25 DIAGNOSIS — R142 Eructation: Secondary | ICD-10-CM

## 2015-12-25 MED ORDER — ONDANSETRON 4 MG PO TBDP: 4.0000 mg | ORAL_TABLET | Freq: Three times a day (TID) | ORAL | Status: DC | PRN

## 2015-12-25 NOTE — Progress Notes (Signed)
Pre visit review using our clinic review tool, if applicable. No additional management support is needed unless otherwise documented below in the visit note.   Chief Complaint  Patient presents with  . GI Problem    Started on Monday.  Vomited once on Tuesday.  Has not taken medications due to stomach upset.  . Gas  . Burping    HPI: Patient Sheri Jensen  comes in today for SDA for  new problem evaluation.  Onset  Sudden Really upset stomach growling and vomiting and didntwant to move .  2 weeks prev lasted  2 days. no pain  ROS: See pertinent positives and negatives per HPI having chronic burping in past months  No wegiht loss . Had one time sharp pain  Left  On breast .  Last  Ted  4 second  No sob no shoulder .  No cough saltines  White rice broth . No dysphagia .  pepto  Bismol.  No one else sick  Much better today but still  Stomach upset .  No fever  Blood in stool of diarrhea  gu sx.   No recent etoh bad food asa  No fever chills   Past Medical History  Diagnosis Date  . Hyperlipidemia   . Hypertension   . Depression     Family History  Problem Relation Age of Onset  . Crohn's disease Daughter   . Cancer Maternal Grandfather     colon  . Diabetes Paternal Grandfather   . Deafness Grandchild     congenital    Social History   Social History  . Marital Status: Married    Spouse Name: N/A  . Number of Children: N/A  . Years of Education: N/A   Social History Main Topics  . Smoking status: Never Smoker   . Smokeless tobacco: Never Used  . Alcohol Use: Yes     Comment: 2 glasses of wine daily   . Drug Use: No  . Sexual Activity: Not Asked   Other Topics Concern  . None   Social History Narrative   No tobacco    hh of 2   A cat    etoh 2-3 wine  per day    With meals    Married one son and one daughter   Exercise well.    Occupation:  Former Programmer, systems    Outpatient Prescriptions Prior to Visit  Medication Sig Dispense Refill  . buPROPion  (WELLBUTRIN XL) 150 MG 24 hr tablet Take 1 tablet (150 mg total) by mouth daily. (Patient not taking: Reported on 12/25/2015) 30 tablet 3  . busPIRone (BUSPAR) 10 MG tablet Take 0.5 tablets (5 mg total) by mouth 2 (two) times daily. Increase to 10 mg twice a day over 2-3 weeks or as directed (Patient not taking: Reported on 12/25/2015) 60 tablet 2  . calcium-vitamin D (OSCAL) 250-125 MG-UNIT per tablet Take 1 tablet by mouth daily. Reported on 12/25/2015    . lisinopril (PRINIVIL,ZESTRIL) 10 MG tablet TAKE 1 TABLET(10 MG) BY MOUTH DAILY (Patient not taking: Reported on 12/25/2015) 30 tablet 8  . Multiple Vitamins-Minerals (WOMENS ONE DAILY PO) Take by mouth daily.     No facility-administered medications prior to visit.     EXAM:  BP 110/74 mmHg  Temp(Src) 97.8 F (36.6 C) (Oral)  Ht  (1.626 m)  Wt 97 lb 14.4 oz (44.407 kg)  BMI 16.80 kg/m2  Body mass index is 16.8 kg/(m^2).  GENERAL: vitals reviewed and  listed above, alert, oriented, appears well hydrated mild discomfort well groomed non toxic  HEENT: atraumatic, conjunctiva  clear, no obvious abnormalities on inspection of external nose and ears OP : no lesion edema or exudate  NECK: no obvious masses on inspection palpation  LUNGS: clear to auscultation bilaterally, no wheezes, rales or rhonchi, good air movement CV: HRRR, no clubbing cyanosis or  peripheral edema nl cap refill  Abdomen:  Sof,t normal bowel sounds without hepatosplenomegaly, no guarding rebound or masses no CVA tenderness mild  discomfort mid epigastrum   MS: moves all extremities without noticeable focal  abnormality PSYCH: pleasant and cooperative, skin non icteric .  ekg no acute changes  Normal sinus rate 71 ASSESSMENT AND PLAN:  Discussed the following assessment and plan:  Nausea and vomiting, intractability of vomiting not specified, unspecified vomiting type - Plan: EKG 12-Lead, US Abdomen Complete  Other chest pain - Plan: EKG 12-Lead  Burping -  Plan: US Abdomen Complete This episode Acute gastritis improving however she does have some baseline symptoms but no dysphagia obstructive symptoms. Low threshold to do further workup discussed getting ultrasound we'll proceed with this. She has a follow-up visit next week anyway wasn't able to take her medicines this week because of her illness. Can use Zofran if needed over the weekend. Has been avoiding alcohol. -Patient advised to return or notify health care team  if symptoms worsen ,persist or new concerns arise.  Patient Instructions  This is probabaly gastritis  Virus or food eaten but  There are other evaluations we should do if the is coontinues  Or recurrs .   Ultrasound  Blood tests etc.  For now  Add zantac or pepcid in the meantime to see if helps.' Weill send in anti nausea medicine as needed . Will be contacted about ultrasound abdomen .    [name]. Panosh M.D.

## 2015-12-25 NOTE — Patient Instructions (Addendum)
This is probabaly gastritis  Virus or food eaten but  There are other evaluations we should do if the is coontinues  Or recurrs .   Ultrasound  Blood tests etc.  For now  Add zantac or pepcid in the meantime to see if helps.' Weill send in anti nausea medicine as needed . Will be contacted about ultrasound abdomen .

## 2016-01-01 ENCOUNTER — Encounter: Payer: Self-pay | Admitting: Internal Medicine

## 2016-01-01 ENCOUNTER — Ambulatory Visit (INDEPENDENT_AMBULATORY_CARE_PROVIDER_SITE_OTHER): Payer: Medicare Other | Admitting: Internal Medicine

## 2016-01-01 VITALS — BP 106/60 | Temp 98.7°F | Ht 64.0 in | Wt 101.1 lb

## 2016-01-01 DIAGNOSIS — M129 Arthropathy, unspecified: Secondary | ICD-10-CM

## 2016-01-01 DIAGNOSIS — I10 Essential (primary) hypertension: Secondary | ICD-10-CM

## 2016-01-01 DIAGNOSIS — R142 Eructation: Secondary | ICD-10-CM | POA: Diagnosis not present

## 2016-01-01 DIAGNOSIS — R112 Nausea with vomiting, unspecified: Secondary | ICD-10-CM

## 2016-01-01 DIAGNOSIS — R0989 Other specified symptoms and signs involving the circulatory and respiratory systems: Secondary | ICD-10-CM | POA: Diagnosis not present

## 2016-01-01 DIAGNOSIS — Z79899 Other long term (current) drug therapy: Secondary | ICD-10-CM

## 2016-01-01 DIAGNOSIS — M19049 Primary osteoarthritis, unspecified hand: Secondary | ICD-10-CM

## 2016-01-01 DIAGNOSIS — F4323 Adjustment disorder with mixed anxiety and depressed mood: Secondary | ICD-10-CM

## 2016-01-01 NOTE — Patient Instructions (Signed)
Glad you are doing better. Agree with dec etoh as discussed . Stay on the wellbutin 150 xr per day Try  The buspar twice a day and if interferes with sleep then go back to once a day .  Get the abdominal ultrasound any whay to look at liver and  Gallbladder  .    ROV in  6 months  Or as needed .

## 2016-01-01 NOTE — Progress Notes (Signed)
Pre visit review using our clinic review tool, if applicable. No additional management support is needed unless otherwise documented below in the visit note.  Chief Complaint  Patient presents with  . Follow-up    meds  gi sx     HPI: Sheri Jensen 70 y.o.  comes in for chronic disease/ medication management  A number of Other issues  And fu of vomiting abd pain and burping (see last visit)  Doing so much better . Didn't ned the zofran  pepcid prn helped.  appt for [place] is feb 22  Some help with anxiety less sighing .  Med wellbutrin 150xl  And  buspar 10 mg bid but misses about 4 x per week late dose  ? If effects sleep    Sleep  Improved .  Wine less   Since sick  And that may have helped   Adherance :Taking  Lisinopril  in am buspar  Not bid most day  wellbutrin daily  ROS: See pertinent positives and negatives per HPI. Weeks of right throat tickle that makes her cough  No pain otherwise .  Not like her typical PND   No rx  wriosts and hand thumbs hurt  At times  ? If could have cts no weakness left worse?   Past Medical History  Diagnosis Date  . Hyperlipidemia   . Hypertension   . Depression     Family History  Problem Relation Age of Onset  . Crohn's disease Daughter   . Cancer Maternal Grandfather     colon  . Diabetes Paternal Grandfather   . Deafness Grandchild     congenital    Social History   Social History  . Marital Status: Married    Spouse Name: N/A  . Number of Children: N/A  . Years of Education: N/A   Social History Main Topics  . Smoking status: Never Smoker   . Smokeless tobacco: Never Used  . Alcohol Use: Yes     Comment: 2 glasses of wine daily   . Drug Use: No  . Sexual Activity: Not Asked   Other Topics Concern  . None   Social History Narrative   No tobacco    hh of 2   A cat    etoh 2-3 wine  per day    With meals    Married one son and one daughter   Exercise well.    Occupation:  Former Programmer, systems    Outpatient  Prescriptions Prior to Visit  Medication Sig Dispense Refill  . buPROPion (WELLBUTRIN XL) 150 MG 24 hr tablet Take 1 tablet (150 mg total) by mouth daily. 30 tablet 3  . busPIRone (BUSPAR) 10 MG tablet Take 0.5 tablets (5 mg total) by mouth 2 (two) times daily. Increase to 10 mg twice a day over 2-3 weeks or as directed 60 tablet 2  . calcium-vitamin D (OSCAL) 250-125 MG-UNIT per tablet Take 1 tablet by mouth daily. Reported on 12/25/2015    . lisinopril (PRINIVIL,ZESTRIL) 10 MG tablet TAKE 1 TABLET(10 MG) BY MOUTH DAILY 30 tablet 8  . Multiple Vitamins-Minerals (WOMENS ONE DAILY PO) Take by mouth daily.    . ondansetron (ZOFRAN-ODT) 4 MG disintegrating tablet Take 1 tablet (4 mg total) by mouth every 8 (eight) hours as needed for nausea or vomiting. 20 tablet 0   No facility-administered medications prior to visit.     EXAM:  BP 106/60 mmHg  Temp(Src) 98.7 F (37.1 C) (Oral)  Ht 5'  4" (1.626 m)  Wt 101 lb 1.6 oz (45.859 kg)  BMI 17.35 kg/m2  Body mass index is 17.35 kg/(m^2).  GENERAL: vitals reviewed and listed above, alert, oriented, appears well hydrated and in no acute distress looks well today  HEENT: atraumatic, conjunctiva  clear, no obvious abnormalities on inspection of external nose and ears OP : no lesion edema or exudate  NECK: no obvious masses on inspection palpation  LUNGS: clear to auscultation bilaterally, no wheezes, rales or rhonchi, good air movement CV: HRRR, no clubbing cyanosis or  peripheral edema nl cap refill  MS: moves all extremities without noticeable focal  Abnormality  Hands wrist with arthritis changes at base of thumb  No swelling redness grip ok tenderness radiatlly nv grossly no PSYCH: pleasant and cooperative, no obvious depression or anxiety  PHQ-SADS   Somatic 8 ( but most related to her recent illness ( 5)  gad7 1, phq9 4 somewhat difficult   ASSESSMENT AND PLAN:  Discussed the following assessment and plan:  Nausea and vomiting,  intractability of vomiting not specified, unspecified vomiting type - resolved  consdier gastritis  agree minim etoh  proceed with [place] assess liver bilitiary tract .  Burping - improved  prn pepcid   Essential hypertension - seems much better off etoh options to stop ace and monitro   Tickle in throat - r side with cough ? atypical acei efect vs pnd  trial off tiral antihistamine or flonase  Medication management  Adjustment reaction with anxiety and depression - better at this time  continue lsi and meds see text  Hand arthritis - with poss tenosynoviits  disc optinos for hand eval  avoiding nsaids with gi sx. Plan get [place]  bp readings and  Update on cough tickle  If not better then rov    Otherwise get [place]   PV in novmenber  earilier if all of above not resolving  -Patient advised to return or notify health care team  if symptoms worsen ,persist or new concerns arise.  Patient Instructions  Glad you are doing better. Agree with dec etoh as discussed . Stay on the wellbutin 150 xr per day Try  The buspar twice a day and if interferes with sleep then go back to once a day .  Get the abdominal ultrasound any whay to look at liver and  Gallbladder  .    ROV in  6 months  Or as needed .       [name]. [name] M.D.

## 2016-01-06 ENCOUNTER — Ambulatory Visit
Admission: RE | Admit: 2016-01-06 | Discharge: 2016-01-06 | Disposition: A | Discharge status: Home / Self Care | Payer: Medicare Other | Source: Ambulatory Visit | Attending: Internal Medicine | Admitting: Internal Medicine

## 2016-01-06 DIAGNOSIS — R142 Eructation: Secondary | ICD-10-CM

## 2016-01-06 DIAGNOSIS — R935 Abnormal findings on diagnostic imaging of other abdominal regions, including retroperitoneum: Secondary | ICD-10-CM | POA: Diagnosis not present

## 2016-01-06 DIAGNOSIS — R112 Nausea with vomiting, unspecified: Secondary | ICD-10-CM

## 2016-01-08 ENCOUNTER — Other Ambulatory Visit: Payer: Self-pay | Admitting: Internal Medicine

## 2016-01-08 NOTE — Telephone Encounter (Signed)
Sent to the pharmacy by e-scribe. 

## 2016-01-08 NOTE — Telephone Encounter (Signed)
Should pt come in for appt?

## 2016-01-08 NOTE — Telephone Encounter (Signed)
I just saw her and can come back for hher wellness in the fall . Ok to refill 9 months worth

## 2016-01-15 ENCOUNTER — Telehealth: Payer: Self-pay | Admitting: Internal Medicine

## 2016-01-15 DIAGNOSIS — R9230 Dense breasts, unspecified: Secondary | ICD-10-CM

## 2016-01-15 DIAGNOSIS — R922 Inconclusive mammogram: Secondary | ICD-10-CM

## 2016-01-15 HISTORY — DX: Dense breasts, unspecified: R92.30

## 2016-01-15 NOTE — Telephone Encounter (Signed)
She has  Very dense breasts  I advise 3 d  mammo

## 2016-01-15 NOTE — Telephone Encounter (Signed)
Pt call to ask which Mammogram she should have. The 3D or the regular

## 2016-01-18 NOTE — Telephone Encounter (Signed)
Pt returned my call.  Left a message on my machine.  Tried to call her back.  Left a message for a return call.

## 2016-01-18 NOTE — Telephone Encounter (Signed)
Left a message for a return call.

## 2016-01-20 NOTE — Telephone Encounter (Signed)
[facility] due to dense breast tissue.

## 2016-03-30 ENCOUNTER — Other Ambulatory Visit: Payer: Self-pay | Admitting: Internal Medicine

## 2016-03-30 NOTE — Telephone Encounter (Signed)
Pt did not return for follow up but has been seen acutely.

## 2016-03-31 NOTE — Telephone Encounter (Signed)
Sent to the pharmacy by e-scribe. 

## 2016-03-31 NOTE — Telephone Encounter (Signed)
Ok to refill through November.

## 2016-04-22 ENCOUNTER — Encounter: Payer: Self-pay | Admitting: Internal Medicine

## 2016-04-25 NOTE — Telephone Encounter (Signed)
Contact  Patient and pharmacy   What will they cover ?   This is  A pretty standard generic med  So I am surprised not covered. Need more information.

## 2016-09-28 ENCOUNTER — Other Ambulatory Visit (INDEPENDENT_AMBULATORY_CARE_PROVIDER_SITE_OTHER): Payer: Medicare Other

## 2016-09-28 DIAGNOSIS — Z0000 Encounter for general adult medical examination without abnormal findings: Secondary | ICD-10-CM

## 2016-09-28 LAB — LIPID PANEL
CHOLESTEROL: 248 mg/dL — AB (ref 0–200)
HDL: 104.4 mg/dL (ref >39.00)
LDL Cholesterol: 129 mg/dL — ABNORMAL HIGH (ref 0–99)
NONHDL: 143.56
TRIGLYCERIDES: 75 mg/dL (ref 0.0–149.0)
Total CHOL/HDL Ratio: 2
VLDL: 15 mg/dL (ref 0.0–40.0)

## 2016-09-28 LAB — BASIC METABOLIC PANEL
BUN: 17 mg/dL (ref 6–23)
CHLORIDE: 98 meq/L (ref 96–112)
CO2: 29 mEq/L (ref 19–32)
Calcium: 10 mg/dL (ref 8.4–10.5)
Creatinine, Ser: 0.74 mg/dL (ref 0.40–1.20)
GFR: 82.77 mL/min (ref >60.00)
Glucose, Bld: 91 mg/dL (ref 70–99)
POTASSIUM: 4 meq/L (ref 3.5–5.1)
Sodium: 136 mEq/L (ref 135–145)

## 2016-09-28 LAB — CBC WITH DIFFERENTIAL/PLATELET
BASOS PCT: 0.7 % (ref 0.0–3.0)
Basophils Absolute: 0 10*3/uL (ref 0.0–0.1)
EOS PCT: 6.1 % — AB (ref 0.0–5.0)
Eosinophils Absolute: 0.3 10*3/uL (ref 0.0–0.7)
HEMATOCRIT: 40 % (ref 36.0–46.0)
HEMOGLOBIN: 13.3 g/dL (ref 12.0–15.0)
Lymphocytes Relative: 39.6 % (ref 12.0–46.0)
Lymphs Abs: 2 10*3/uL (ref 0.7–4.0)
MCHC: 33.1 g/dL (ref 30.0–36.0)
MCV: 97.1 fl (ref 78.0–100.0)
MONO ABS: 0.5 10*3/uL (ref 0.1–1.0)
MONOS PCT: 9.5 % (ref 3.0–12.0)
Neutro Abs: 2.2 10*3/uL (ref 1.4–7.7)
Neutrophils Relative %: 44.1 % (ref 43.0–77.0)
Platelets: 283 10*3/uL (ref 150.0–400.0)
RBC: 4.12 Mil/uL (ref 3.87–5.11)
RDW: 13.7 % (ref 11.5–15.5)
WBC: 5.1 10*3/uL (ref 4.0–10.5)

## 2016-09-28 LAB — HEPATIC FUNCTION PANEL
ALBUMIN: 4.7 g/dL (ref 3.5–5.2)
ALT: 14 U/L (ref 0–35)
AST: 18 U/L (ref 0–37)
Alkaline Phosphatase: 54 U/L (ref 39–117)
BILIRUBIN TOTAL: 0.7 mg/dL (ref 0.2–1.2)
Bilirubin, Direct: 0.1 mg/dL (ref 0.0–0.3)
TOTAL PROTEIN: 7.4 g/dL (ref 6.0–8.3)

## 2016-09-28 LAB — TSH: TSH: 1.74 u[IU]/mL (ref 0.35–4.50)

## 2016-10-05 ENCOUNTER — Encounter: Payer: Medicare Other | Admitting: Internal Medicine

## 2016-10-12 ENCOUNTER — Ambulatory Visit (INDEPENDENT_AMBULATORY_CARE_PROVIDER_SITE_OTHER): Payer: Medicare Other | Admitting: Internal Medicine

## 2016-10-12 ENCOUNTER — Encounter: Payer: Self-pay | Admitting: Internal Medicine

## 2016-10-12 VITALS — BP 132/80 | Temp 98.8°F | Ht 63.25 in | Wt 105.8 lb

## 2016-10-12 DIAGNOSIS — Z0000 Encounter for general adult medical examination without abnormal findings: Secondary | ICD-10-CM

## 2016-10-12 DIAGNOSIS — I10 Essential (primary) hypertension: Secondary | ICD-10-CM

## 2016-10-12 DIAGNOSIS — Z79899 Other long term (current) drug therapy: Secondary | ICD-10-CM | POA: Diagnosis not present

## 2016-10-12 DIAGNOSIS — R198 Other specified symptoms and signs involving the digestive system and abdomen: Secondary | ICD-10-CM

## 2016-10-12 DIAGNOSIS — Z23 Encounter for immunization: Secondary | ICD-10-CM | POA: Diagnosis not present

## 2016-10-12 DIAGNOSIS — F325 Major depressive disorder, single episode, in full remission: Secondary | ICD-10-CM | POA: Diagnosis not present

## 2016-10-12 DIAGNOSIS — R39198 Other difficulties with micturition: Secondary | ICD-10-CM

## 2016-10-12 NOTE — Progress Notes (Signed)
Pre visit review using our clinic review tool, if applicable. No additional management support is needed unless otherwise documented below in the visit note.  Chief Complaint  Patient presents with  . Medicare Wellness    HPI: Sheri MalkinKarla E Jensen 70 y.o. comes in today for Preventive Medicare wellness visit  And Chronic disease management med management .Since last visit. Doing pretty well   Ran out of antidepressant meds and  Denied by insurance and then communication problem with office so just stayed off of recent months and seems to be doing ok with depression . Finds energy and motivation in grandchildren  caretaking .   Insomnia a bit better .   Bp seems ok  Taking acei off an on  ? If needs to continue  No obv se noted    GI stomach is "giving her fits '  ? Bloating loose  No vomiting  ? Some nausea  No weight loss uncertain if food related  Has hx of mild intolerance and daughter with crohns in remission  Co of hard to empty bladder at times and has to push  No real incontinuence   Hematuria or uti sx  Not assoc with constipation    Health Maintenance  Topic Date Due  . MAMMOGRAM  02/10/2017  . COLONOSCOPY  12/30/2018  . TETANUS/TDAP  08/10/2020  . INFLUENZA VACCINE  Completed  . DEXA SCAN  Completed  . ZOSTAVAX  Addressed  . Hepatitis C Screening  Completed  . PNA vac Low Risk Adult  Completed   Health Maintenance Review LIFESTYLE:  TAD down to 2 etoh per day and doing better Sugar beverages:n Sleep: better  exrecises  Aerobic body sculpt strength training    MEDICARE DOCUMENT QUESTIONS  TO SCAN     Hearing: ok  Vision:  No limitations at present . Last eye check   2015   Safety:  Has smoke detector and wears seat belts.  No firearms. No excess sun exposure. Sees dentist regularly.  Falls: n  Advance directive :  Reviewed  Has one.  Memory: Felt to be good  , no concern from her or her family.  Depression: No anhedonia unusual crying or depressive  symptoms  Nutrition: Eats well balanced diet; adequate calcium and vitamin D. No swallowing chewing problems.  Injury: no major injuries in the last six months.  Other healthcare providers:  Reviewed today .  Social:  Lives with spouse married.  Preventive parameters: up-to-date  Reviewed   ADLS:   There are no problems or need for assistance  driving, feeding, obtaining food, dressing, toileting and bathing, managing money using phone. She is independent. caretakes  grnadchildren  ROS:  GEN/ HEENT: No fever, significant weight changes sweats headaches vision problems hearing changes, CV/ PULM; No chest pain shortness of breath cough, syncope,edema  change in exercise tolerance. GI /GU: See HPI . No blood in the stool. See above for GU symptoms. SKIN/HEME: ,no acute skin rashes suspicious lesions or bleeding. No lymphadenopathy, nodules, masses.  NEURO/ PSYCH:  No neurologic signs such as weakness numbness. No depression anxiety. IMM/ Allergy: No unusual infections.  Allergy .   REST of 12 system review negative except as per HPI   Past Medical History:  Diagnosis Date  . Depression   . Hyperlipidemia   . Hypertension     Family History  Problem Relation Age of Onset  . Crohn's disease Daughter   . Cancer Maternal Grandfather     colon  . Diabetes  Paternal Grandfather   . Deafness Grandchild     congenital    Social History   Social History  . Marital status: Married    Spouse name: N/A  . Number of children: N/A  . Years of education: N/A   Social History Main Topics  . Smoking status: Never Smoker  . Smokeless tobacco: Never Used  . Alcohol use Yes     Comment: 2 glasses of wine daily   . Drug use: No  . Sexual activity: Not Asked   Other Topics Concern  . None   Social History Narrative   No tobacco    hh of 2   A cat    etoh 2-3 wine  per day    With meals    Married one son and one daughter   Exercise well.    Occupation:  Former Secondary school teacher Prescriptions as of 10/12/2016  Medication Sig  . busPIRone (BUSPAR) 10 MG tablet TAKE 1/2 TABLET(5 MG) BY MOUTH TWICE DAILY. INCREASE TO 10 MG 2 TIMES A DAY OVER 2-3 WEEKS OR AS DIRECTED  . calcium-vitamin D (OSCAL) 250-125 MG-UNIT per tablet Take 1 tablet by mouth daily. Reported on 12/25/2015  . lisinopril (PRINIVIL,ZESTRIL) 10 MG tablet TAKE 1 TABLET(10 MG) BY MOUTH DAILY  . Multiple Vitamins-Minerals (WOMENS ONE DAILY PO) Take by mouth daily.  Marland Kitchen buPROPion (WELLBUTRIN XL) 150 MG 24 hr tablet TAKE 1 TABLET(150 MG) BY MOUTH DAILY. INCREASE TO 2 BY MOUTH EVERY DAY AFTER 1 WEEK (Patient not taking: Reported on 10/12/2016)  . [DISCONTINUED] buPROPion (WELLBUTRIN XL) 150 MG 24 hr tablet Take 1 tablet (150 mg total) by mouth daily.   No facility-administered encounter medications on file as of 10/12/2016.     EXAM:  BP 132/80 (BP Location: Right Arm, Patient Position: Sitting, Cuff Size: Normal)   Temp 98.8 F (37.1 C) (Oral)   Ht 5' 3.25" (1.607 m)   Wt 105 lb 12.8 oz (48 kg)   BMI 18.59 kg/m   Body mass index is 18.59 kg/m.  Physical Exam: Vital signs reviewed ZOX:WRUE is a well-developed well-nourished alert cooperative   who appears stated age in no acute distress.  HEENT: normocephalic atraumatic , Eyes: PERRL EOM's full, conjunctiva clear, Nares: paten,t no deformity discharge or tenderness., Ears: no deformity EAC's clear TMs with normal landmarks. Mouth: clear OP, no lesions, edema.  Moist mucous membranes. Dentition in adequate repair. NECK: supple without masses, thyromegaly or bruits. CHEST/PULM:  Clear to auscultation and percussion breath sounds equal no wheeze , rales or rhonchi. No chest wall deformities or tenderness. CV: PMI is nondisplaced, S1 S2 no gallops, murmurs, rubs. Peripheral pulses are full without delay.No JVD . Breast: normal by inspection . No dimpling, discharge, masses, tenderness or discharge . ABDOMEN: Bowel sounds normal  nontender  No guard or rebound, no hepato splenomegal no CVA tenderness.   Extremtities:  No clubbing cyanosis or edema, no acute joint swelling or redness no focal atrophy NEURO:  Oriented x3, cranial nerves 3-12 appear to be intact, no obvious focal weakness,gait within normal limits no abnormal reflexes SKIN: No acute rashes normal turgor, color, no bruising or petechiae. PSYCH: Oriented, good eye contact, no obvious depression anxiety, cognition and judgment appear normal. LN: no cervical axillary inguinal adenopathy No noted deficits in memory, attention, and speech.   Lab Results  Component Value Date   WBC 5.1 09/28/2016   HGB 13.3 09/28/2016   HCT 40.0 09/28/2016   PLT  283.0 09/28/2016   GLUCOSE 91 09/28/2016   CHOL 248 (H) 09/28/2016   TRIG 75.0 09/28/2016   HDL 104.40 09/28/2016   LDLDIRECT 109.9 06/11/2013   LDLCALC 129 (H) 09/28/2016   ALT 14 09/28/2016   AST 18 09/28/2016   NA 136 09/28/2016   K 4.0 09/28/2016   CL 98 09/28/2016   CREATININE 0.74 09/28/2016   BUN 17 09/28/2016   CO2 29 09/28/2016   TSH 1.74 09/28/2016   Wt Readings from Last 3 Encounters:  10/12/16 105 lb 12.8 oz (48 kg)  01/01/16 101 lb 1.6 oz (45.9 kg)  12/25/15 97 lb 14.4 oz (44.4 kg)    Results reviewed   ASSESSMENT AND PLAN:  Discussed the following assessment and plan:  Visit for preventive health examination  Medicare annual wellness visit, subsequent  Essential hypertension - controlled disc med and new guidlines for now continue med   Medication management  Major depression in remission (HCC) - ran out of med and seems doing well    limited etoh seems to have helped also   Need for prophylactic vaccination and inoculation against influenza - Plan: Flu vaccine HIGH DOSE PF (Fluzone High dose)  GI symptoms - hx mild intolerance disc intervnetions ? if coulb be caused but acei but not assoc with timing to get back with us and poss gi consult  Voiding difficulty - unclear  significance no med effect alarm findings disc consider urology eval if progressieve and problematic avoid decongestants etc  utd hcm Patient Care Team: Madelin HeadingsWanda K Panosh, MD as PCP - General  Patient Instructions  Try pepcid  Every day for at least  2-3 weeks and then   Can wean as needed  Consider  Trying lactose  Free    For a few weeks also to see if lactose intolerance could  cause this .... Other options try probiotic   daily for a few weeks .   There is a  Diet called FODMAP  That has been used for people with irritable bowel. .    If you are getting vomiting weight loss unintentional  Or blood etc ongoing.   we may get GI opinion.   BP goal is 120/80 or below   Ok to stay off the antidepressant for now .   If having  Ongoing bladder issues contact us  Consider urology evaluation.        Neta MendsWanda K. Panosh M.D.

## 2016-10-12 NOTE — Patient Instructions (Addendum)
Try pepcid  Every day for at least  2-3 weeks and then   Can wean as needed  Consider  Trying lactose  Free    For a few weeks also to see if lactose intolerance could  cause this .... Other options try probiotic   daily for a few weeks .   There is a  Diet called FODMAP  That has been used for people with irritable bowel. .    If you are getting vomiting weight loss unintentional  Or blood etc ongoing.   we may get GI opinion.   BP goal is 120/80 or below   Ok to stay off the antidepressant for now .   If having  Ongoing bladder issues contact us  Consider urology evaluation.

## 2016-11-23 ENCOUNTER — Other Ambulatory Visit: Payer: Self-pay | Admitting: Internal Medicine

## 2016-11-23 NOTE — Telephone Encounter (Signed)
Sent to the pharmacy by e-scribe.  Pt due for yearly 11/18.

## 2017-02-24 DIAGNOSIS — M8589 Other specified disorders of bone density and structure, multiple sites: Secondary | ICD-10-CM | POA: Diagnosis not present

## 2017-02-24 DIAGNOSIS — Z8262 Family history of osteoporosis: Secondary | ICD-10-CM | POA: Diagnosis not present

## 2017-02-24 DIAGNOSIS — Z1231 Encounter for screening mammogram for malignant neoplasm of breast: Secondary | ICD-10-CM | POA: Diagnosis not present

## 2017-02-24 LAB — HM MAMMOGRAPHY

## 2017-02-24 LAB — HM DEXA SCAN

## 2017-03-03 ENCOUNTER — Encounter: Payer: Self-pay | Admitting: Family Medicine

## 2017-03-06 ENCOUNTER — Encounter: Payer: Self-pay | Admitting: Family Medicine

## 2017-05-25 DIAGNOSIS — H2513 Age-related nuclear cataract, bilateral: Secondary | ICD-10-CM | POA: Diagnosis not present

## 2017-06-06 IMAGING — US US ABDOMEN COMPLETE
1 series · 14 of 25 positions shown · non-contrast
Comparison: None.

CLINICAL DATA: 69-year-old female with nausea and intractable
vomiting, belching for 2 weeks. Initial encounter.

EXAM:
ABDOMEN ULTRASOUND COMPLETE

[Series 1: us abdomen complete · 0.20mm/px · 14 of 91 slices shown]
[im 1/91]
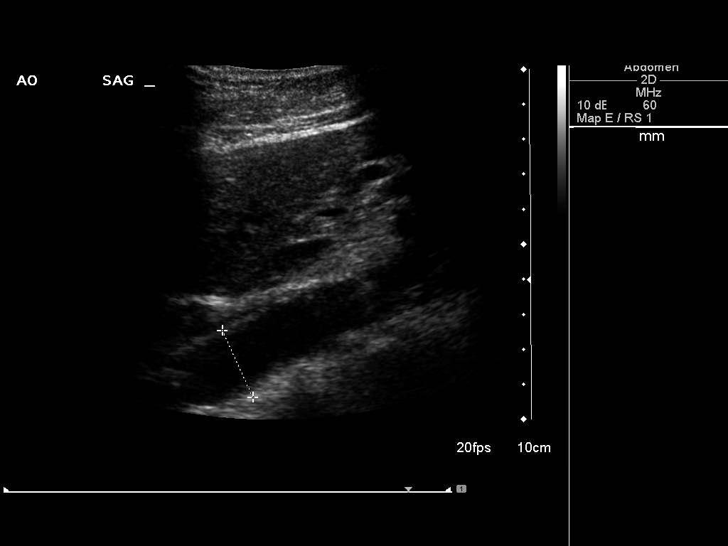
[im 8/91]
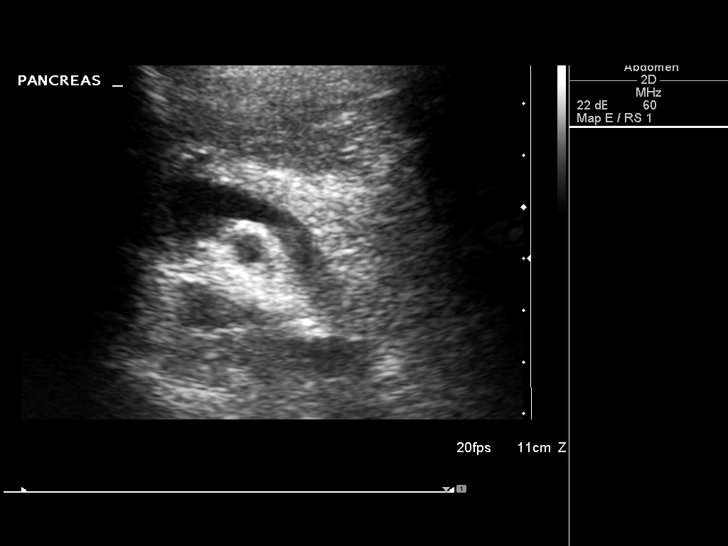
[im 16/91]
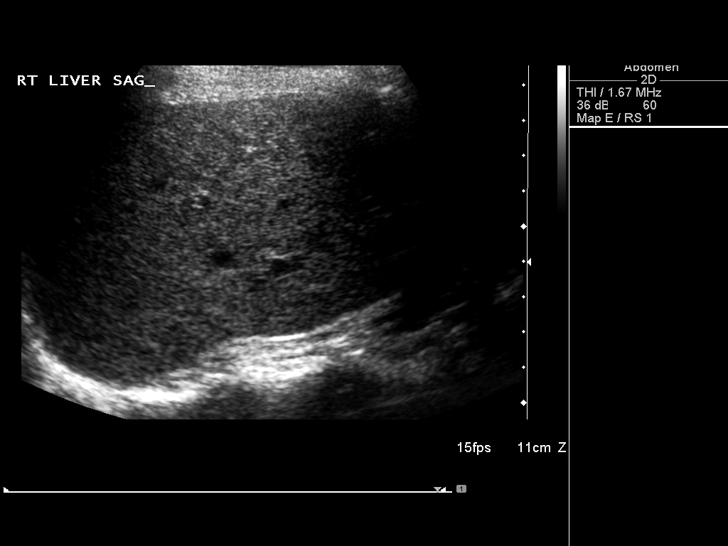
[im 23/91]
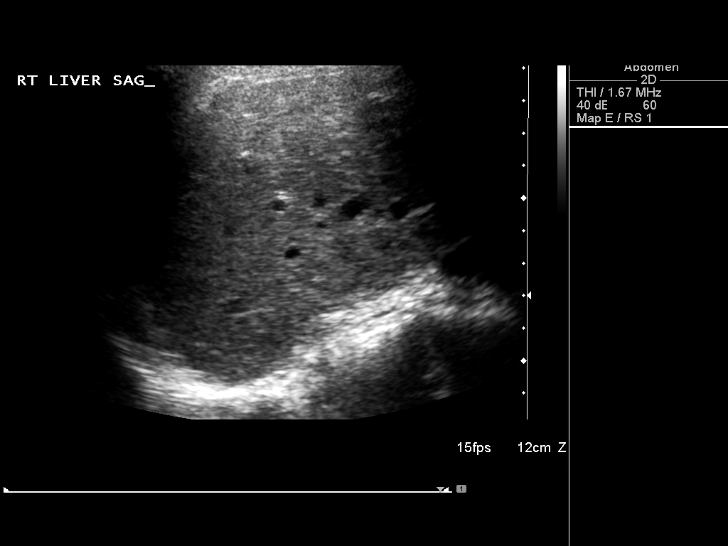
[im 31/91]
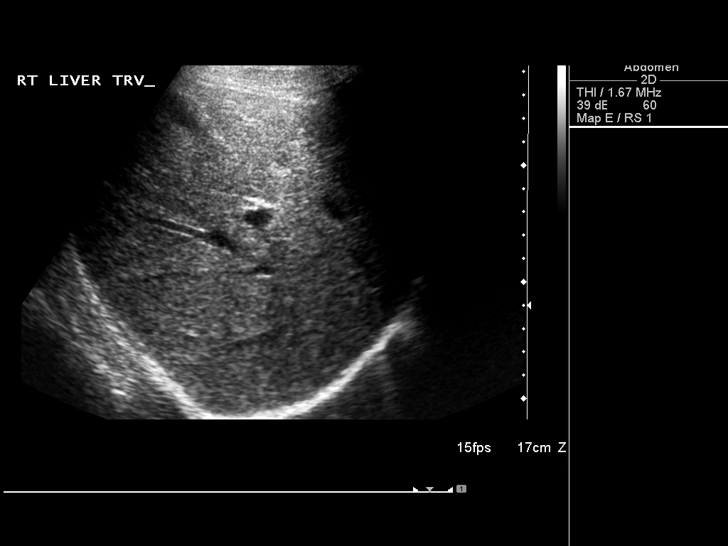
[im 34/91]
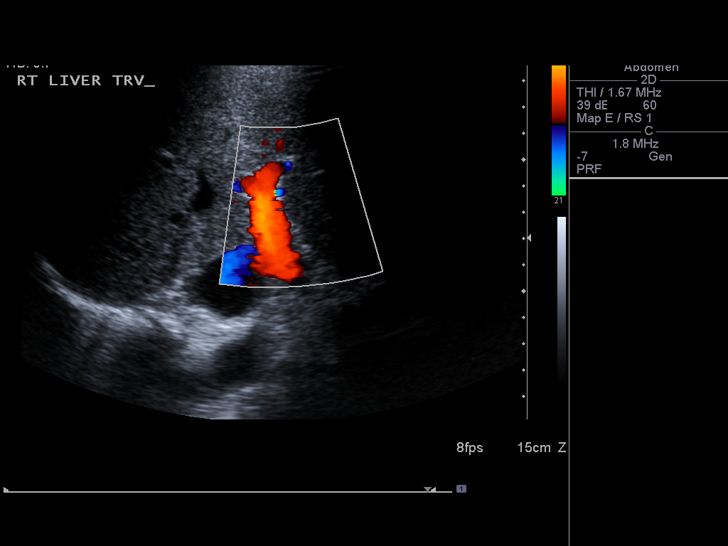
[im 42/91]
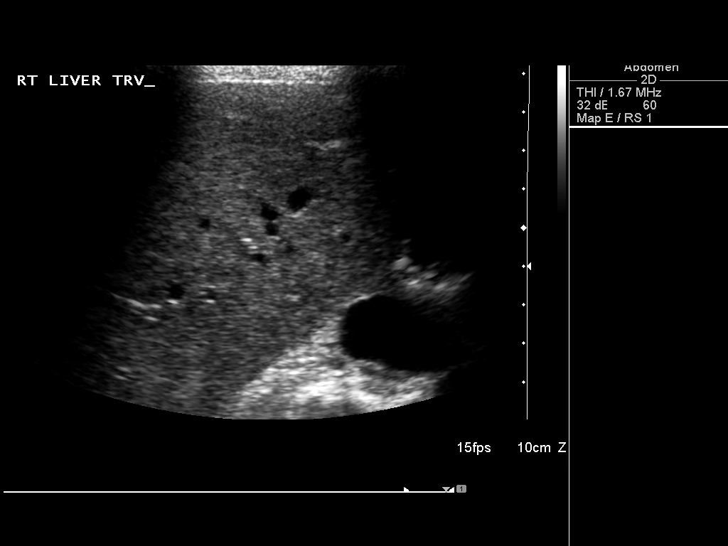
[im 49/91]
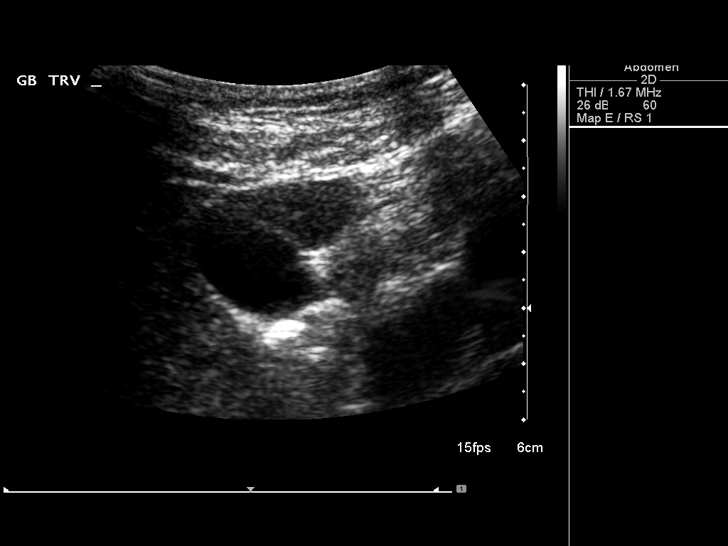
[im 57/91]
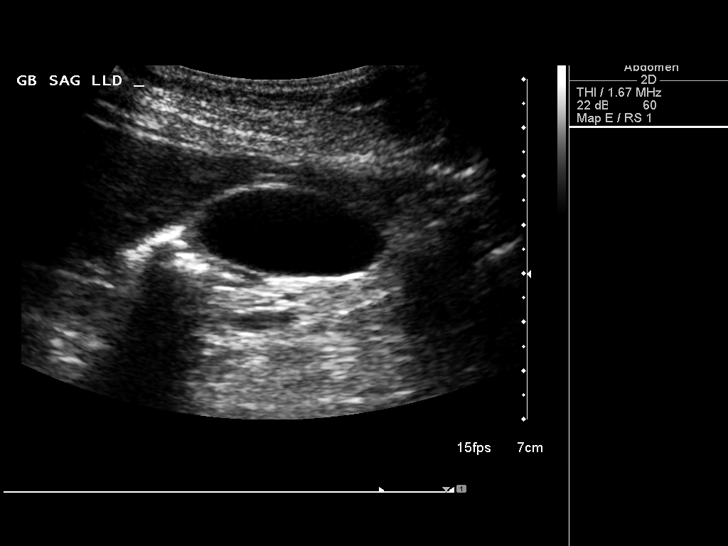
[im 61/91]
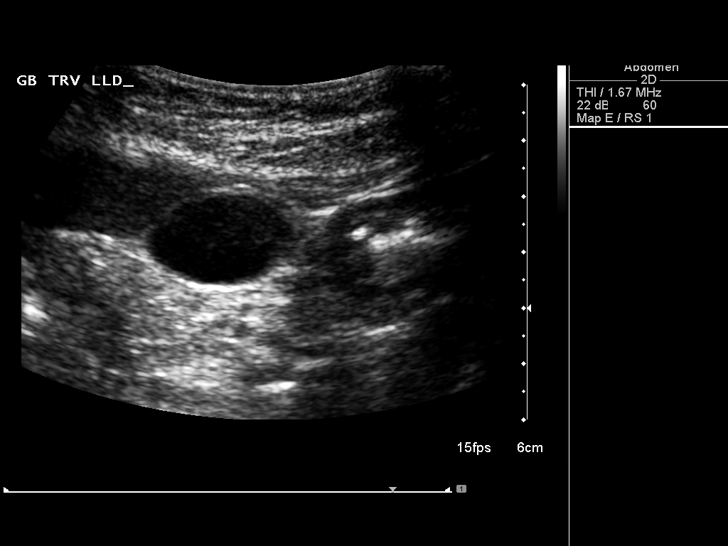
[im 68/91]
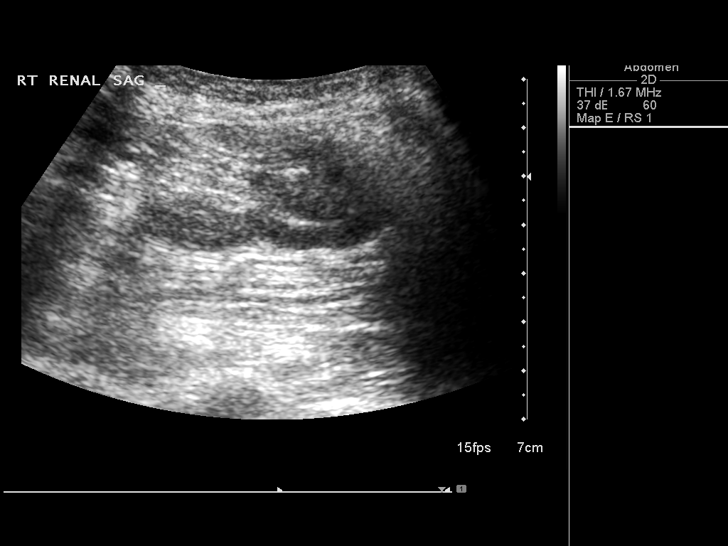
[im 76/91]
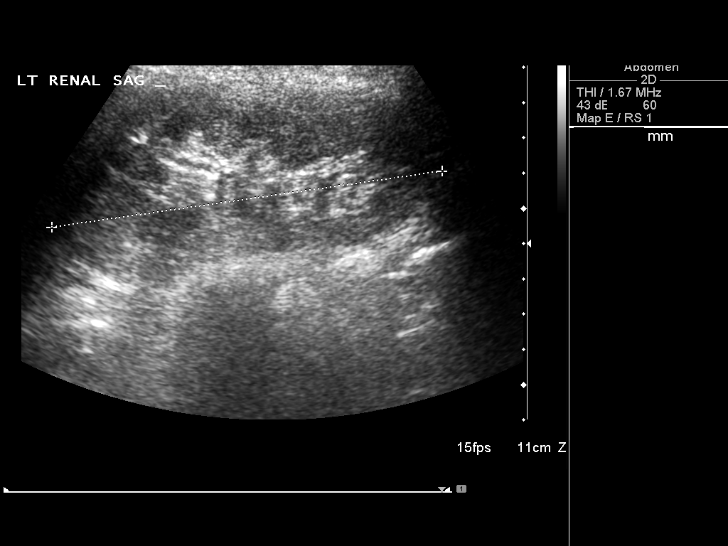
[im 83/91]
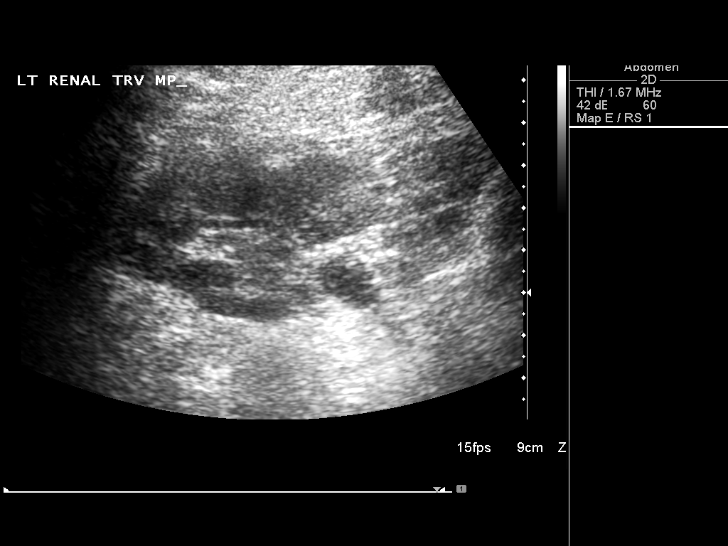
[im 91/91]
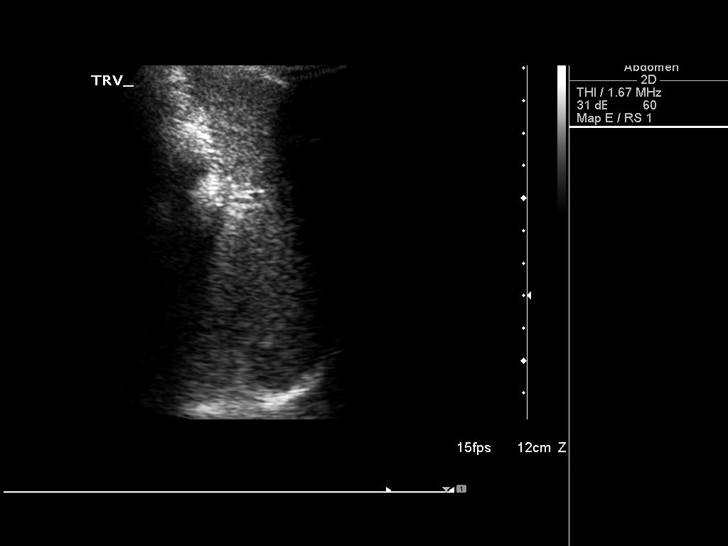

[14 of 25 positions shown; findings below may reference images not displayed]

FINDINGS: Gallbladder: No gallstones or wall thickening visualized. No
sonographic Murphy sign noted by sonographer.

Common bile duct: Diameter: 3 mm, normal

Liver: No focal lesion identified. Within normal limits in
parenchymal echogenicity.

IVC: No abnormality visualized.

Pancreas: Visualized portion unremarkable.

Spleen: Size and appearance within normal limits.

Right Kidney: Length: 9.8 cm. Echogenicity within normal limits. No
mass or hydronephrosis visualized.

Left Kidney: Length: 11.2 cm. Echogenicity within normal limits. No
mass or hydronephrosis visualized.

Abdominal aorta: No aneurysm visualized.

Other findings: None.
IMPRESSION: Normal abdomen ultrasound.

## 2017-08-03 NOTE — Progress Notes (Signed)
Chief Complaint  Patient presents with  . Annual Exam    Elevated BP    HPI: Sheri Jensen 71 y.o. comes in today for Preventive Medicare exam/  Medicare   She has not been taking the lisinopril 10 mgfor a number of months because it made her so tired during her feet felt like lead.  She does bring in a log of her blood pressure readings intermittently for the last couple months. Majority are in the 120/80 or below range a couple 140s or high 130s.  Does not feel depressed is off of the Wellbutrin but is taking a once a day BuSpar for anxiety seems to do fine with just 1 pill a day at the 10 mg. Would like a refill.   Health Maintenance  Topic Date Due  . INFLUENZA VACCINE  06/14/2017  . COLONOSCOPY  12/30/2018  . MAMMOGRAM  02/25/2019  . TETANUS/TDAP  08/10/2020  . DEXA SCAN  Completed  . Hepatitis C Screening  Completed  . PNA vac Low Risk Adult  Completed   Health Maintenance Review LIFESTYLE:  Exercise:   3-4 per week  Tobacco/ETS:  no Alcohol:  1-2  Per night. Sugar beverages: Sleep:  About  7 - 8   Drug use: no    HH:  2   One bird     Hearing:     Not certain. May be dec reading no affecting social  Vision:  No limitations at present . Last eye check UTD glasses  Safety:  Has smoke detector and wears seat belts.  No firearms. No excess sun exposure. Sees dentist regularly.  Falls:   Advance directive :  Reviewed  Has one.  Memory: Felt to be good  , no concern from her or her family.  Depression: No anhedonia unusual crying or depressive symptoms  Nutrition: Eats well balanced diet; adequate calcium and vitamin D. No swallowing chewing problems.  Injury: no major injuries in the last six months.  Other healthcare providers:  Reviewed today .  Social:  Lives with spouse married. Pet bird    Preventive parameters: up-to-date  Reviewed   ADLS:   There are no problems or need for assistance  driving, feeding, obtaining food, dressing, toileting and  bathing, managing money using phone. She is independent.   ROS:  GEN/ HEENT: No fever, significant weight changes sweats headaches vision problems hearing changes, CV/ PULM; No chest pain shortness of breath cough, syncope,edema  change in exercise tolerance. GI /GU: No adominal pain, vomiting, change in bowel habits. No blood in the stool. No significant GU symptoms. SKIN/HEME: ,no acute skin rashes suspicious lesions or bleeding. No lymphadenopathy, nodules, masses.  NEURO/ PSYCH:  No neurologic signs such as weakness numbness. No depression anxiety. IMM/ Allergy: No unusual infections.  Allergy .   REST of 12 system review negative except as per HPI   Past Medical History:  Diagnosis Date  . Depression   . Hyperlipidemia   . Hypertension     Family History  Problem Relation Age of Onset  . Crohn's disease Daughter   . Cancer Maternal Grandfather        colon  . Diabetes Paternal Grandfather   . Deafness Grandchild        congenital  grand child congenital deafness well with cochlear implants   Social History   Social History  . Marital status: Married    Spouse name: N/A  . Number of children: N/A  . Years of  education: N/A   Social History Main Topics  . Smoking status: Never Smoker  . Smokeless tobacco: Never Used  . Alcohol use Yes     Comment: 2 glasses of wine daily   . Drug use: No  . Sexual activity: Not Asked   Other Topics Concern  . None   Social History Narrative   No tobacco    hh of 2   A cat    etoh 2-3 wine  per day    With meals    Married one son and one daughter   Exercise well.    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 calcium-vitamin D (OSCAL) 250-125 MG-UNIT per tablet Take 1 tablet by mouth daily. Reported on 12/25/2015  . diphenhydrAMINE (BENADRYL) 25 MG tablet Take 1 tablet by mouth as needed.  . Multiple Vitamins-Minerals (WOMENS ONE DAILY PO) Take by mouth daily.    . busPIRone (BUSPAR) 10 MG tablet TAKE 1/2 TABLET(5 MG) BY MOUTH TWICE DAILY. INCREASE TO 10 MG 2 TIMES A DAY OVER 2-3 WEEKS OR AS DIRECTED  . [DISCONTINUED] buPROPion (WELLBUTRIN XL) 150 MG 24 hr tablet TAKE 1 TABLET(150 MG) BY MOUTH DAILY. INCREASE TO 2 BY MOUTH EVERY DAY AFTER 1 WEEK (Patient not taking: Reported on 08/04/2017)  . [DISCONTINUED] busPIRone (BUSPAR) 10 MG tablet TAKE 1/2 TABLET(5 MG) BY MOUTH TWICE DAILY. INCREASE TO 10 MG 2 TIMES A DAY OVER 2-3 WEEKS OR AS DIRECTED (Patient not taking: Reported on 08/04/2017)  . [DISCONTINUED] lisinopril (PRINIVIL,ZESTRIL) 10 MG tablet TAKE 1 TABLET(10 MG) BY MOUTH DAILY (Patient not taking: Reported on 08/04/2017)   No facility-administered encounter medications on file as of 08/04/2017.     EXAM:  BP 138/68 (BP Location: Right Arm, Patient Position: Sitting, Cuff Size: Normal)   Pulse 67   Temp 98.2 F (36.8 C) (Oral)   Wt 107 lb 9.6 oz (48.8 kg)   BMI 18.91 kg/m   Body mass index is 18.91 kg/m.  Physical Exam: Vital signs reviewed [id number] is a well-developed well-nourished alert cooperative   who appears stated age in no acute distress.  HEENT: normocephalic atraumatic , Eyes: PERRL EOM's full, conjunctiva clear, Nares: paten,t no deformity discharge or tenderness., Ears: no deformity EAC's clear TMs with normal landmarks. Mouth: clear OP, no lesions, edema.  Moist mucous membranes. Dentition in adequate repair. NECK: supple without masses, thyromegaly or bruits. CHEST/PULM:  Clear to auscultation and percussion breath sounds equal no wheeze , rales or rhonchi. No chest wall deformities or tenderness.Breast: normal by inspection . No dimpling, discharge, masses, tenderness or discharge . CV: PMI is nondisplaced, S1 S2 no gallops, murmurs, rubs. Peripheral pulses are full without delay.No JVD .  ABDOMEN: Bowel sounds normal nontender  No guard or rebound, no hepato splenomegal no CVA tenderness.   Extremtities:  No clubbing cyanosis  or edema, no acute joint swelling or redness no focal atrophy NEURO:  Oriented x3, cranial nerves 3-12 appear to be intact, no obvious focal weakness,gait within normal limits no abnormal reflexes or asymmetrical SKIN: No acute rashes normal turgor, color, no bruising or petechiae. PSYCH: Oriented, good eye contact, no obvious depression anxiety, cognition and judgment appear normal. LN: no cervical axillary inguinal adenopathy No noted deficits in memory, attention, and speech.   Lab Results  Component Value Date   WBC 5.1 09/28/2016   HGB 13.3 09/28/2016   HCT 40.0 09/28/2016   PLT 283.0 09/28/2016  GLUCOSE 93 08/04/2017   CHOL 236 (H) 08/04/2017   TRIG 94.0 08/04/2017   HDL 84.40 08/04/2017   LDLDIRECT 109.9 06/11/2013   LDLCALC 132 (H) 08/04/2017   ALT 14 09/28/2016   AST 18 09/28/2016   NA 138 08/04/2017   K 3.9 08/04/2017   CL 100 08/04/2017   CREATININE 0.75 08/04/2017   BUN 20 08/04/2017   CO2 32 08/04/2017   TSH 1.74 09/28/2016   BP Readings from Last 3 Encounters:  08/04/17 138/68  10/12/16 132/80  01/01/16 106/60   Wt Readings from Last 3 Encounters:  08/04/17 107 lb 9.6 oz (48.8 kg)  10/12/16 105 lb 12.8 oz (48 kg)  01/01/16 101 lb 1.6 oz (45.9 kg)   bp log reviewed  Most at goal 120 or below and 70 diastolic  ioff meds  On dietary changes   ASSESSMENT AND PLAN:  Discussed the following assessment and plan:  Visit for preventive health examination  Medication management - refill buspar and adjsut med as  tolerated  - Plan: Lipid panel, Basic metabolic panel  Hyperlipidemia, unspecified hyperlipidemia type - Plan: Lipid panel, Basic metabolic panel  Need for influenza vaccination - Plan: Flu vaccine HIGH DOSE PF (Fluzone High dose)  Essential hypertension - seemingly controlled by lifestyle with change in diet okay to remain off medicine lisinopril 10 mg may have been too potent. Follow-up readings. - Plan: Lipid panel, Basic metabolic  panel  Osteopenia, unspecified location Can repeat in 2-3 years  Patient Care Team: Burnis Medin, MD as PCP - General  Patient Instructions  Continue lifestyle intervention healthy eating and exercise .  Take blood pressure readings twice a day for 3-5 days  days  Every other month to make sure close to goal of 120/80 range    .Send in readings     If elevated    If needed would use a different dose and med than lisinopril 10 mg  . May hve been too strong for you  Will refill buspar   For you  I hearing  A problem in conversation advise  Audiology evaluation .   If lab ok then  Yearly cpx in a year.    Preventive Care 38 Years and Older, Female Preventive care refers to lifestyle choices and visits with your health care provider that can promote health and wellness. What does preventive care include?  A yearly physical exam. This is also called an annual well check.  Dental exams once or twice a year.  Routine eye exams. Ask your health care provider how often you should have your eyes checked.  Personal lifestyle choices, including: ? Daily care of your teeth and gums. ? Regular physical activity. ? Eating a healthy diet. ? Avoiding tobacco and drug use. ? Limiting alcohol use. ? Practicing safe sex. ? Taking low-dose aspirin every day. ? Taking vitamin and mineral supplements as recommended by your health care provider. What happens during an annual well check? The services and screenings done by your health care provider during your annual well check will depend on your age, overall health, lifestyle risk factors, and family history of disease. Counseling Your health care provider may ask you questions about your:  Alcohol use.  Tobacco use.  Drug use.  Emotional well-being.  Home and relationship well-being.  Sexual activity.  Eating habits.  History of falls.  Memory and ability to understand (cognition).  Work and work Statistician.  Reproductive  health.  Screening You may have the following  tests or measurements:  Height, weight, and BMI.  Blood pressure.  Lipid and cholesterol levels. These may be checked every 5 years, or more frequently if you are over 59 years old.  Skin check.  Lung cancer screening. You may have this screening every year starting at age 21 if you have a 30-pack-year history of smoking and currently smoke or have quit within the past 15 years.  Fecal occult blood test (FOBT) of the stool. You may have this test every year starting at age 34.  Flexible sigmoidoscopy or colonoscopy. You may have a sigmoidoscopy every 5 years or a colonoscopy every 10 years starting at age 11.  Hepatitis C blood test.  Hepatitis B blood test.  Sexually transmitted disease (STD) testing.  Diabetes screening. This is done by checking your blood sugar (glucose) after you have not eaten for a while (fasting). You may have this done every 1-3 years.  Bone density scan. This is done to screen for osteoporosis. You may have this done starting at age 56.  Mammogram. This may be done every 1-2 years. Talk to your health care provider about how often you should have regular mammograms.  Talk with your health care provider about your test results, treatment options, and if necessary, the need for more tests. Vaccines Your health care provider may recommend certain vaccines, such as:  Influenza vaccine. This is recommended every year.  Tetanus, diphtheria, and acellular pertussis (Tdap, Td) vaccine. You may need a Td booster every 10 years.  Varicella vaccine. You may need this if you have not been vaccinated.  Zoster vaccine. You may need this after age 8.  Measles, mumps, and rubella (MMR) vaccine. You may need at least one dose of MMR if you were born in 1957 or later. You may also need a second dose.  Pneumococcal 13-valent conjugate (PCV13) vaccine. One dose is recommended after age 74.  Pneumococcal polysaccharide  (PPSV23) vaccine. One dose is recommended after age 47.  Meningococcal vaccine. You may need this if you have certain conditions.  Hepatitis A vaccine. You may need this if you have certain conditions or if you travel or work in places where you may be exposed to hepatitis A.  Hepatitis B vaccine. You may need this if you have certain conditions or if you travel or work in places where you may be exposed to hepatitis B.  Haemophilus influenzae type b (Hib) vaccine. You may need this if you have certain conditions.  Talk to your health care provider about which screenings and vaccines you need and how often you need them. This information is not intended to replace advice given to you by your health care provider. Make sure you discuss any questions you have with your health care provider. Document Released: 11/27/2015 Document Revised: 07/20/2016 Document Reviewed: 09/01/2015 Elsevier Interactive Patient Education  2017 [employer] Olof Marcil M.D.

## 2017-08-04 ENCOUNTER — Encounter: Payer: Self-pay | Admitting: Internal Medicine

## 2017-08-04 ENCOUNTER — Ambulatory Visit (INDEPENDENT_AMBULATORY_CARE_PROVIDER_SITE_OTHER): Payer: Medicare Other | Admitting: Internal Medicine

## 2017-08-04 VITALS — BP 138/68 | HR 67 | Temp 98.2°F | Wt 107.6 lb

## 2017-08-04 DIAGNOSIS — M8580 Other specified disorders of bone density and structure, unspecified site: Secondary | ICD-10-CM

## 2017-08-04 DIAGNOSIS — E785 Hyperlipidemia, unspecified: Secondary | ICD-10-CM

## 2017-08-04 DIAGNOSIS — Z23 Encounter for immunization: Secondary | ICD-10-CM

## 2017-08-04 DIAGNOSIS — I10 Essential (primary) hypertension: Secondary | ICD-10-CM

## 2017-08-04 DIAGNOSIS — F329 Major depressive disorder, single episode, unspecified: Secondary | ICD-10-CM

## 2017-08-04 DIAGNOSIS — Z79899 Other long term (current) drug therapy: Secondary | ICD-10-CM

## 2017-08-04 DIAGNOSIS — Z0000 Encounter for general adult medical examination without abnormal findings: Secondary | ICD-10-CM

## 2017-08-04 LAB — BASIC METABOLIC PANEL
BUN: 20 mg/dL (ref 6–23)
CALCIUM: 10 mg/dL (ref 8.4–10.5)
CO2: 32 meq/L (ref 19–32)
CREATININE: 0.75 mg/dL (ref 0.40–1.20)
Chloride: 100 mEq/L (ref 96–112)
GFR: 81.3 mL/min (ref >60.00)
Glucose, Bld: 93 mg/dL (ref 70–99)
Potassium: 3.9 mEq/L (ref 3.5–5.1)
Sodium: 138 mEq/L (ref 135–145)

## 2017-08-04 LAB — LIPID PANEL
CHOL/HDL RATIO: 3
Cholesterol: 236 mg/dL — ABNORMAL HIGH (ref 0–200)
HDL: 84.4 mg/dL (ref >39.00)
LDL Cholesterol: 132 mg/dL — ABNORMAL HIGH (ref 0–99)
NONHDL: 151.28
TRIGLYCERIDES: 94 mg/dL (ref 0.0–149.0)
VLDL: 18.8 mg/dL (ref 0.0–40.0)

## 2017-08-04 LAB — HM DEXA SCAN

## 2017-08-04 MED ORDER — BUSPIRONE HCL 10 MG PO TABS: ORAL_TABLET | ORAL | 9 refills | Status: DC

## 2017-08-04 NOTE — Patient Instructions (Addendum)
Continue lifestyle intervention healthy eating and exercise .  Take blood pressure readings twice a day for 3-5 days  days  Every other month to make sure close to goal of 120/80 range    .Send in readings     If elevated    If needed would use a different dose and med than lisinopril 10 mg  . May hve been too strong for you  Will refill buspar   For you  I hearing  A problem in conversation advise  Audiology evaluation .   If lab ok then  Yearly cpx in a year.    Preventive Care 69 Years and Older, Female Preventive care refers to lifestyle choices and visits with your health care provider that can promote health and wellness. What does preventive care include?  A yearly physical exam. This is also called an annual well check.  Dental exams once or twice a year.  Routine eye exams. Ask your health care provider how often you should have your eyes checked.  Personal lifestyle choices, including: ? Daily care of your teeth and gums. ? Regular physical activity. ? Eating a healthy diet. ? Avoiding tobacco and drug use. ? Limiting alcohol use. ? Practicing safe sex. ? Taking low-dose aspirin every day. ? Taking vitamin and mineral supplements as recommended by your health care provider. What happens during an annual well check? The services and screenings done by your health care provider during your annual well check will depend on your age, overall health, lifestyle risk factors, and family history of disease. Counseling Your health care provider may ask you questions about your:  Alcohol use.  Tobacco use.  Drug use.  Emotional well-being.  Home and relationship well-being.  Sexual activity.  Eating habits.  History of falls.  Memory and ability to understand (cognition).  Work and work Statistician.  Reproductive health.  Screening You may have the following tests or measurements:  Height, weight, and BMI.  Blood pressure.  Lipid and cholesterol levels.  These may be checked every 5 years, or more frequently if you are over 46 years old.  Skin check.  Lung cancer screening. You may have this screening every year starting at age 50 if you have a 30-pack-year history of smoking and currently smoke or have quit within the past 15 years.  Fecal occult blood test (FOBT) of the stool. You may have this test every year starting at age 53.  Flexible sigmoidoscopy or colonoscopy. You may have a sigmoidoscopy every 5 years or a colonoscopy every 10 years starting at age 40.  Hepatitis C blood test.  Hepatitis B blood test.  Sexually transmitted disease (STD) testing.  Diabetes screening. This is done by checking your blood sugar (glucose) after you have not eaten for a while (fasting). You may have this done every 1-3 years.  Bone density scan. This is done to screen for osteoporosis. You may have this done starting at age 45.  Mammogram. This may be done every 1-2 years. Talk to your health care provider about how often you should have regular mammograms.  Talk with your health care provider about your test results, treatment options, and if necessary, the need for more tests. Vaccines Your health care provider may recommend certain vaccines, such as:  Influenza vaccine. This is recommended every year.  Tetanus, diphtheria, and acellular pertussis (Tdap, Td) vaccine. You may need a Td booster every 10 years.  Varicella vaccine. You may need this if you have not been  vaccinated.  Zoster vaccine. You may need this after age 62.  Measles, mumps, and rubella (MMR) vaccine. You may need at least one dose of MMR if you were born in 1957 or later. You may also need a second dose.  Pneumococcal 13-valent conjugate (PCV13) vaccine. One dose is recommended after age 24.  Pneumococcal polysaccharide (PPSV23) vaccine. One dose is recommended after age 53.  Meningococcal vaccine. You may need this if you have certain conditions.  Hepatitis A  vaccine. You may need this if you have certain conditions or if you travel or work in places where you may be exposed to hepatitis A.  Hepatitis B vaccine. You may need this if you have certain conditions or if you travel or work in places where you may be exposed to hepatitis B.  Haemophilus influenzae type b (Hib) vaccine. You may need this if you have certain conditions.  Talk to your health care provider about which screenings and vaccines you need and how often you need them. This information is not intended to replace advice given to you by your health care provider. Make sure you discuss any questions you have with your health care provider. Document Released: 11/27/2015 Document Revised: 07/20/2016 Document Reviewed: 09/01/2015 Elsevier Interactive Patient Education  2017 [employer].

## 2017-08-10 ENCOUNTER — Encounter: Payer: Self-pay | Admitting: *Deleted

## 2017-08-11 ENCOUNTER — Telehealth: Payer: Self-pay | Admitting: *Deleted

## 2017-08-11 NOTE — Telephone Encounter (Signed)
Patient called in for lab results:  Notes recorded by Madelin Headings, MD on 08/04/2017 at 3:35 PM EDT Cholesterol panel stable , blood sugar And renal; function is normal  Continue lifestyle intervention healthy eating and exercise .  Pt notified of instructions and verbalized understanding.

## 2017-08-12 ENCOUNTER — Other Ambulatory Visit: Payer: Self-pay | Admitting: Internal Medicine

## 2017-08-14 NOTE — Telephone Encounter (Signed)
Ask patient   Pt said not  Taking recnetly  Please ask her if she is going to try the  or 1/2 of 10 mg    Ok to fill this if she wishes

## 2017-08-14 NOTE — Telephone Encounter (Signed)
Spoke with patient and states that she is not taking this medication at all and would not like a refill. States that PCP is aware that she has stop taking medication

## 2017-08-14 NOTE — Telephone Encounter (Signed)
This medication has been d/c 08/04/2017. Pharmacy is requesting a refill. Please advise.

## 2017-08-16 NOTE — Telephone Encounter (Signed)
sounds good    please take the medicine off her list and tell the pharmacy did not assess for any more refills.

## 2017-08-17 ENCOUNTER — Other Ambulatory Visit: Payer: Self-pay | Admitting: Emergency Medicine

## 2017-08-17 NOTE — Telephone Encounter (Signed)
Attempted to call pharmacy, they are not open yet.

## 2017-08-17 NOTE — Telephone Encounter (Signed)
Attempted to reach pharmacy was placed on long hold. Will try to call back

## 2018-02-13 ENCOUNTER — Encounter: Payer: Self-pay | Admitting: Internal Medicine

## 2018-02-28 ENCOUNTER — Encounter: Payer: Self-pay | Admitting: Internal Medicine

## 2018-02-28 DIAGNOSIS — M8589 Other specified disorders of bone density and structure, multiple sites: Secondary | ICD-10-CM | POA: Diagnosis not present

## 2018-02-28 DIAGNOSIS — Z1231 Encounter for screening mammogram for malignant neoplasm of breast: Secondary | ICD-10-CM | POA: Diagnosis not present

## 2018-04-18 ENCOUNTER — Encounter: Payer: Self-pay | Admitting: Internal Medicine

## 2018-08-03 NOTE — Progress Notes (Signed)
Chief Complaint  Patient presents with  . Annual Exam    No new concerns    HPI: Patient  Sheri Jensen  72 y.o. comes in today for Preventive Health Care visit   Anxiety med cased fatigue so stopped  Doing well otherwise  . exercising taking care of grand kids  bp seems ok  Health Maintenance  Topic Date Due  . INFLUENZA VACCINE  02/13/2019 (Originally 06/14/2018)  . COLONOSCOPY  12/30/2018  . MAMMOGRAM  02/25/2019  . TETANUS/TDAP  08/10/2020  . DEXA SCAN  Completed  . Hepatitis C Screening  Completed  . PNA vac Low Risk Adult  Completed   Health Maintenance Review LIFESTYLE:  Exercise:  3 -4 per week .  Tobacco/ETS: no Alcohol:  Down to one a day.  Sugar beverages:  no Sleep: good and bad.  Drug use: no HH of   2 1 bird .     Drives friends.   ROS:   ? Hearing.     GEN/ HEENT: No fever, significant weight changes sweats headaches vision problems hearing changes, CV/ PULM; No chest pain shortness of breath cough, syncope,edema  change in exercise tolerance. GI /GU: No adominal pain, vomiting, change in bowel habits. No blood in the stool. No significant GU symptoms. SKIN/HEME: ,no acute skin rashes suspicious lesions or bleeding. No lymphadenopathy, nodules, masses.  NEURO/ PSYCH:  No neurologic signs such as weakness numbness. No depression anxiety. IMM/ Allergy: No unusual infections.  Allergy .   REST of 12 system review negative except as per HPI   Past Medical History:  Diagnosis Date  . Depression   . Hyperlipidemia   . Hypertension     Past Surgical History:  Procedure Laterality Date  . chidbirth     x2  . COLONOSCOPY  03/00   2/10- normal     Family History  Problem Relation Age of Onset  . Crohn's disease Daughter   . Cancer Maternal Grandfather        colon  . Diabetes Paternal Grandfather   . Deafness Grandchild        congenital    Social History   Socioeconomic History  . Marital status: Married    Spouse name: Not on file  .  Number of children: Not on file  . Years of education: Not on file  . Highest education level: Not on file  Occupational History  . Not on file  Social Needs  . Financial resource strain: Not on file  . Food insecurity:    Worry: Not on file    Inability: Not on file  . Transportation needs:    Medical: Not on file    Non-medical: Not on file  Tobacco Use  . Smoking status: Never Smoker  . Smokeless tobacco: Never Used  Substance and Sexual Activity  . Alcohol use: Yes    Comment: 2 glasses of wine daily   . Drug use: No  . Sexual activity: Not on file  Lifestyle  . Physical activity:    Days per week: Not on file    Minutes per session: Not on file  . Stress: Not on file  Relationships  . Social connections:    Talks on phone: Not on file    Gets together: Not on file    Attends religious service: Not on file    Active member of club or organization: Not on file    Attends meetings of clubs or organizations: Not on file  Relationship status: Not on file  Other Topics Concern  . Not on file  Social History Narrative   No tobacco    hh of 2   A cat    etoh 2-3 wine  per day    With meals    Married one son and one daughter   Exercise well.    Occupation:  Former Tourist information centre manager  retired Pharmacist, hospital      EXAM:  BP 118/68 (BP Location: Right Arm, Patient Position: Sitting, Cuff Size: Normal)   Pulse 79   Temp 98.4 F (36.9 C) (Oral)   Ht 5' 3.5" (1.613 m)   Wt 105 lb 6.4 oz (47.8 kg)   BMI 18.38 kg/m   Body mass index is 18.38 kg/m. Wt Readings from Last 3 Encounters:  08/06/18 105 lb 6.4 oz (47.8 kg)  08/04/17 107 lb 9.6 oz (48.8 kg)  10/12/16 105 lb 12.8 oz (48 kg)    Physical Exam: Vital signs reviewed XAJ:OINO is a well-developed well-nourished alert cooperative    who appearsr stated age in no acute distress.  HEENT: normocephalic atraumatic , Eyes: PERRL EOM's full, conjunctiva clear, Nares: paten,t no deformity discharge or tenderness., Ears: no  deformity EAC's clear TMs with normal landmarks. Mouth: clear OP, no lesions, edema.  Moist mucous membranes. Dentition in adequate repair. NECK: supple without masses, thyromegaly or bruits. CHEST/PULM:  Clear to auscultation and percussion breath sounds equal no wheeze , rales or rhonchi. No chest wall deformities or tenderness. Breast: normal by inspection . No dimpling, discharge, masses, tenderness or discharge . CV: PMI is nondisplaced, S1 S2 no gallops, murmurs, rubs. Peripheral pulses are full without delay.No JVD .  ABDOMEN: Bowel sounds normal nontender  No guard or rebound, no hepato splenomegal no CVA tenderness.  No hernia. Extremtities:  No clubbing cyanosis or edema, no acute joint swelling or redness no focal atrophy min arthritis sx  NEURO:  Oriented x3, cranial nerves 3-12 appear to be intact, no obvious focal weakness,gait within normal limits no abnormal reflexes or asymmetrical SKIN: No acute rashes normal turgor, color, no bruising or petechiae. PSYCH: Oriented, good eye contact, no obvious depression anxiety, cognition and judgment appear normal. LN: no cervical axillary inguinal adenopathy  Lab Results  Component Value Date   WBC 5.1 09/28/2016   HGB 13.3 09/28/2016   HCT 40.0 09/28/2016   PLT 283.0 09/28/2016   GLUCOSE 100 (H) 08/06/2018   CHOL 262 (H) 08/06/2018   TRIG 170.0 (H) 08/06/2018   HDL 94.30 08/06/2018   LDLDIRECT 109.9 06/11/2013   LDLCALC 134 (H) 08/06/2018   ALT 14 09/28/2016   AST 18 09/28/2016   NA 136 08/06/2018   K 4.7 08/06/2018   CL 97 08/06/2018   CREATININE 0.99 08/06/2018   BUN 20 08/06/2018   CO2 32 08/06/2018   TSH 1.74 09/28/2016    BP Readings from Last 3 Encounters:  08/06/18 118/68  08/04/17 138/68  10/12/16 132/80    ASSESSMENT AND PLAN:  Discussed the following assessment and plan:  Visit for preventive health examination  Hyperlipidemia, unspecified hyperlipidemia type - Plan: Lipid panel, Basic metabolic panel,  Basic metabolic panel, Lipid panel  Osteopenia, unspecified location - Plan: Lipid panel, Basic metabolic panel, Basic metabolic panel, Lipid panel  Medication management - some se  so  ok to stop.  - Plan: Lipid panel, Basic metabolic panel, Basic metabolic panel, Lipid panel Flu vaccine  NA today  Lipids and bmp today or fasting  Is not fasting today  But ok to   Do .  Non medication for anxiety at this time    meds cause se such a s dragged down fatigue .   Continuous  .   exercising resistance and aerobic and  Add poss balance exercise   The 10-year ASCVD risk score Mikey Bussing DC Jr., et al., 2013) is: 8%   Values used to calculate the score:     Age: 58 years     Sex: Female     Is Non-Hispanic African American: No     Diabetic: No     Tobacco smoker: No     Systolic Blood Pressure: 655 mmHg     Is BP treated: No     HDL Cholesterol: 94.3 mg/dL     Total Cholesterol: 262 mg/dL  Patient Care Team: Burnis Medin, MD as PCP - General Patient Instructions  Glad you are doing well.   Continue lifestyle intervention healthy eating and exercise . Consider adding yoga   For balance and  Anxiety reduction.   Will notify you  of labs when available.    Health Maintenance, Female Adopting a healthy lifestyle and getting preventive care can go a long way to promote health and wellness. Talk with your health care provider about what schedule of regular examinations is right for you. This is a good chance for you to check in with your provider about disease prevention and staying healthy. In between checkups, there are plenty of things you can do on your own. Experts have done a lot of research about which lifestyle changes and preventive measures are most likely to keep you healthy. Ask your health care provider for more information. Weight and diet Eat a healthy diet  Be sure to include plenty of vegetables, fruits, low-fat dairy products, and lean protein.  Do not eat a lot of foods  high in solid fats, added sugars, or salt.  Get regular exercise. This is one of the most important things you can do for your health. ? Most adults should exercise for at least 150 minutes each week. The exercise should increase your heart rate and make you sweat (moderate-intensity exercise). ? Most adults should also do strengthening exercises at least twice a week. This is in addition to the moderate-intensity exercise.  Maintain a healthy weight  Body mass index (BMI) is a measurement that can be used to identify possible weight problems. It estimates body fat based on height and weight. Your health care provider can help determine your BMI and help you achieve or maintain a healthy weight.  For females 28 years of age and older: ? A BMI below 18.5 is considered underweight. ? A BMI of 18.5 to 24.9 is normal. ? A BMI of 25 to 29.9 is considered overweight. ? A BMI of 30 and above is considered obese.  Watch levels of cholesterol and blood lipids  You should start having your blood tested for lipids and cholesterol at 70 years of age, then have this test every 5 years.  You may need to have your cholesterol levels checked more often if: ? Your lipid or cholesterol levels are high. ? You are older than 70 years of age. ? You are at high risk for heart disease.  Cancer screening Lung Cancer  Lung cancer screening is recommended for adults 59-64 years old who are at high risk for lung cancer because of a history of smoking.  A yearly low-dose CT scan of the lungs is recommended for  people who: ? Currently smoke. ? Have quit within the past 15 years. ? Have at least a 30-pack-year history of smoking. A pack year is smoking an average of one pack of cigarettes a day for 1 year.  Yearly screening should continue until it has been 15 years since you quit.  Yearly screening should stop if you develop a health problem that would prevent you from having lung cancer treatment.  Breast  Cancer  Practice breast self-awareness. This means understanding how your breasts normally appear and feel.  It also means doing regular breast self-exams. Let your health care provider know about any changes, no matter how small.  If you are in your 20s or 30s, you should have a clinical breast exam (CBE) by a health care provider every 1-3 years as part of a regular health exam.  If you are 42 or older, have a CBE every year. Also consider having a breast X-ray (mammogram) every year.  If you have a family history of breast cancer, talk to your health care provider about genetic screening.  If you are at high risk for breast cancer, talk to your health care provider about having an MRI and a mammogram every year.  Breast cancer gene (BRCA) assessment is recommended for women who have family members with BRCA-related cancers. BRCA-related cancers include: ? Breast. ? Ovarian. ? Tubal. ? Peritoneal cancers.  Results of the assessment will determine the need for genetic counseling and BRCA1 and BRCA2 testing.  Cervical Cancer Your health care provider may recommend that you be screened regularly for cancer of the pelvic organs (ovaries, uterus, and vagina). This screening involves a pelvic examination, including checking for microscopic changes to the surface of your cervix (Pap test). You may be encouraged to have this screening done every 3 years, beginning at age 58.  For women ages 41-65, health care providers may recommend pelvic exams and Pap testing every 3 years, or they may recommend the Pap and pelvic exam, combined with testing for human papilloma virus (HPV), every 5 years. Some types of HPV increase your risk of cervical cancer. Testing for HPV may also be done on women of any age with unclear Pap test results.  Other health care providers may not recommend any screening for nonpregnant women who are considered low risk for pelvic cancer and who do not have symptoms. Ask your  health care provider if a screening pelvic exam is right for you.  If you have had past treatment for cervical cancer or a condition that could lead to cancer, you need Pap tests and screening for cancer for at least 20 years after your treatment. If Pap tests have been discontinued, your risk factors (such as having a new sexual partner) need to be reassessed to determine if screening should resume. Some women have medical problems that increase the chance of getting cervical cancer. In these cases, your health care provider may recommend more frequent screening and Pap tests.  Colorectal Cancer  This type of cancer can be detected and often prevented.  Routine colorectal cancer screening usually begins at 70 years of age and continues through 70 years of age.  Your health care provider may recommend screening at an earlier age if you have risk factors for colon cancer.  Your health care provider may also recommend using home test kits to check for hidden blood in the stool.  A small camera at the end of a tube can be used to examine your colon directly (  sigmoidoscopy or colonoscopy). This is done to check for the earliest forms of colorectal cancer.  Routine screening usually begins at age 58.  Direct examination of the colon should be repeated every 5-10 years through 70 years of age. However, you may need to be screened more often if early forms of precancerous polyps or small growths are found.  Skin Cancer  Check your skin from head to toe regularly.  Tell your health care provider about any new moles or changes in moles, especially if there is a change in a mole's shape or color.  Also tell your health care provider if you have a mole that is larger than the size of a pencil eraser.  Always use sunscreen. Apply sunscreen liberally and repeatedly throughout the day.  Protect yourself by wearing long sleeves, pants, a wide-brimmed hat, and sunglasses whenever you are  outside.  Heart disease, diabetes, and high blood pressure  High blood pressure causes heart disease and increases the risk of stroke. High blood pressure is more likely to develop in: ? People who have blood pressure in the high end of the normal range (130-139/85-89 mm Hg). ? People who are overweight or obese. ? People who are African American.  If you are 81-92 years of age, have your blood pressure checked every 3-5 years. If you are 71 years of age or older, have your blood pressure checked every year. You should have your blood pressure measured twice-once when you are at a hospital or clinic, and once when you are not at a hospital or clinic. Record the average of the two measurements. To check your blood pressure when you are not at a hospital or clinic, you can use: ? An automated blood pressure machine at a pharmacy. ? A home blood pressure monitor.  If you are between 53 years and 5 years old, ask your health care provider if you should take aspirin to prevent strokes.  Have regular diabetes screenings. This involves taking a blood sample to check your fasting blood sugar level. ? If you are at a normal weight and have a low risk for diabetes, have this test once every three years after 70 years of age. ? If you are overweight and have a high risk for diabetes, consider being tested at a younger age or more often. Preventing infection Hepatitis B  If you have a higher risk for hepatitis B, you should be screened for this virus. You are considered at high risk for hepatitis B if: ? You were born in a country where hepatitis B is common. Ask your health care provider which countries are considered high risk. ? Your parents were born in a high-risk country, and you have not been immunized against hepatitis B (hepatitis B vaccine). ? You have HIV or AIDS. ? You use needles to inject street drugs. ? You live with someone who has hepatitis B. ? You have had sex with someone who has  hepatitis B. ? You get hemodialysis treatment. ? You take certain medicines for conditions, including cancer, organ transplantation, and autoimmune conditions.  Hepatitis C  Blood testing is recommended for: ? Everyone born from [birth year] through 1963. ? Anyone with known risk factors for hepatitis C.  Sexually transmitted infections (STIs)  You should be screened for sexually transmitted infections (STIs) including gonorrhea and chlamydia if: ? You are sexually active and are younger than 70 years of age. ? You are older than 70 years of age and your health care provider  tells you that you are at risk for this type of infection. ? Your sexual activity has changed since you were last screened and you are at an increased risk for chlamydia or gonorrhea. Ask your health care provider if you are at risk.  If you do not have HIV, but are at risk, it may be recommended that you take a prescription medicine daily to prevent HIV infection. This is called pre-exposure prophylaxis (PrEP). You are considered at risk if: ? You are sexually active and do not regularly use condoms or know the HIV status of your partner(s). ? You take drugs by injection. ? You are sexually active with a partner who has HIV.  Talk with your health care provider about whether you are at high risk of being infected with HIV. If you choose to begin PrEP, you should first be tested for HIV. You should then be tested every 3 months for as long as you are taking PrEP. Pregnancy  If you are premenopausal and you may become pregnant, ask your health care provider about preconception counseling.  If you may become pregnant, take 400 to 800 micrograms (mcg) of folic acid every day.  If you want to prevent pregnancy, talk to your health care provider about birth control (contraception). Osteoporosis and menopause  Osteoporosis is a disease in which the bones lose minerals and strength with aging. This can result in serious bone  fractures. Your risk for osteoporosis can be identified using a bone density scan.  If you are 42 years of age or older, or if you are at risk for osteoporosis and fractures, ask your health care provider if you should be screened.  Ask your health care provider whether you should take a calcium or vitamin D supplement to lower your risk for osteoporosis.  Menopause may have certain physical symptoms and risks.  Hormone replacement therapy may reduce some of these symptoms and risks. Talk to your health care provider about whether hormone replacement therapy is right for you. Follow these instructions at home:  Schedule regular health, dental, and eye exams.  Stay current with your immunizations.  Do not use any tobacco products including cigarettes, chewing tobacco, or electronic cigarettes.  If you are pregnant, do not drink alcohol.  If you are breastfeeding, limit how much and how often you drink alcohol.  Limit alcohol intake to no more than 1 drink per day for nonpregnant women. One drink equals 12 ounces of beer, 5 ounces of wine, or 1 ounces of hard liquor.  Do not use street drugs.  Do not share needles.  Ask your health care provider for help if you need support or information about quitting drugs.  Tell your health care provider if you often feel depressed.  Tell your health care provider if you have ever been abused or do not feel safe at home. This information is not intended to replace advice given to you by your health care provider. Make sure you discuss any questions you have with your health care provider. Document Released: 05/16/2011 Document Revised: 04/07/2016 Document Reviewed: 08/04/2015 Elsevier Interactive Patient Education  2018 [place]. Daiwik Buffalo M.D.

## 2018-08-06 ENCOUNTER — Ambulatory Visit (INDEPENDENT_AMBULATORY_CARE_PROVIDER_SITE_OTHER): Payer: Medicare Other | Admitting: Internal Medicine

## 2018-08-06 ENCOUNTER — Encounter: Payer: Self-pay | Admitting: Internal Medicine

## 2018-08-06 VITALS — BP 118/68 | HR 79 | Temp 98.4°F | Ht 63.5 in | Wt 105.4 lb

## 2018-08-06 DIAGNOSIS — M8580 Other specified disorders of bone density and structure, unspecified site: Secondary | ICD-10-CM

## 2018-08-06 DIAGNOSIS — Z0000 Encounter for general adult medical examination without abnormal findings: Secondary | ICD-10-CM

## 2018-08-06 DIAGNOSIS — E785 Hyperlipidemia, unspecified: Secondary | ICD-10-CM

## 2018-08-06 DIAGNOSIS — Z79899 Other long term (current) drug therapy: Secondary | ICD-10-CM

## 2018-08-06 LAB — BASIC METABOLIC PANEL
BUN: 20 mg/dL (ref 6–23)
CHLORIDE: 97 meq/L (ref 96–112)
CO2: 32 meq/L (ref 19–32)
CREATININE: 0.99 mg/dL (ref 0.40–1.20)
Calcium: 10.3 mg/dL (ref 8.4–10.5)
GFR: 58.84 mL/min — ABNORMAL LOW (ref >60.00)
GLUCOSE: 100 mg/dL — AB (ref 70–99)
POTASSIUM: 4.7 meq/L (ref 3.5–5.1)
Sodium: 136 mEq/L (ref 135–145)

## 2018-08-06 LAB — LIPID PANEL
CHOL/HDL RATIO: 3
CHOLESTEROL: 262 mg/dL — AB (ref 0–200)
HDL: 94.3 mg/dL (ref >39.00)
LDL CALC: 134 mg/dL — AB (ref 0–99)
NONHDL: 167.58
Triglycerides: 170 mg/dL — ABNORMAL HIGH (ref 0.0–149.0)
VLDL: 34 mg/dL (ref 0.0–40.0)

## 2018-08-06 NOTE — Patient Instructions (Addendum)
Glad you are doing well.   Continue lifestyle intervention healthy eating and exercise . Consider adding yoga   For balance and  Anxiety reduction.   Will notify you  of labs when available.    Health Maintenance, Female Adopting a healthy lifestyle and getting preventive care can go a long way to promote health and wellness. Talk with your health care provider about what schedule of regular examinations is right for you. This is a good chance for you to check in with your provider about disease prevention and staying healthy. In between checkups, there are plenty of things you can do on your own. Experts have done a lot of research about which lifestyle changes and preventive measures are most likely to keep you healthy. Ask your health care provider for more information. Weight and diet Eat a healthy diet  Be sure to include plenty of vegetables, fruits, low-fat dairy products, and lean protein.  Do not eat a lot of foods high in solid fats, added sugars, or salt.  Get regular exercise. This is one of the most important things you can do for your health. ? Most adults should exercise for at least 150 minutes each week. The exercise should increase your heart rate and make you sweat (moderate-intensity exercise). ? Most adults should also do strengthening exercises at least twice a week. This is in addition to the moderate-intensity exercise.  Maintain a healthy weight  Body mass index (BMI) is a measurement that can be used to identify possible weight problems. It estimates body fat based on height and weight. Your health care provider can help determine your BMI and help you achieve or maintain a healthy weight.  For females 57 years of age and older: ? A BMI below 18.5 is considered underweight. ? A BMI of 18.5 to 24.9 is normal. ? A BMI of 25 to 29.9 is considered overweight. ? A BMI of 30 and above is considered obese.  Watch levels of cholesterol and blood lipids  You should  start having your blood tested for lipids and cholesterol at 70 years of age, then have this test every 5 years.  You may need to have your cholesterol levels checked more often if: ? Your lipid or cholesterol levels are high. ? You are older than 70 years of age. ? You are at high risk for heart disease.  Cancer screening Lung Cancer  Lung cancer screening is recommended for adults 62-53 years old who are at high risk for lung cancer because of a history of smoking.  A yearly low-dose CT scan of the lungs is recommended for people who: ? Currently smoke. ? Have quit within the past 15 years. ? Have at least a 30-pack-year history of smoking. A pack year is smoking an average of one pack of cigarettes a day for 1 year.  Yearly screening should continue until it has been 15 years since you quit.  Yearly screening should stop if you develop a health problem that would prevent you from having lung cancer treatment.  Breast Cancer  Practice breast self-awareness. This means understanding how your breasts normally appear and feel.  It also means doing regular breast self-exams. Let your health care provider know about any changes, no matter how small.  If you are in your 20s or 30s, you should have a clinical breast exam (CBE) by a health care provider every 1-3 years as part of a regular health exam.  If you are 40 or older, have  a CBE every year. Also consider having a breast X-ray (mammogram) every year.  If you have a family history of breast cancer, talk to your health care provider about genetic screening.  If you are at high risk for breast cancer, talk to your health care provider about having an MRI and a mammogram every year.  Breast cancer gene (BRCA) assessment is recommended for women who have family members with BRCA-related cancers. BRCA-related cancers include: ? Breast. ? Ovarian. ? Tubal. ? Peritoneal cancers.  Results of the assessment will determine the need  for genetic counseling and BRCA1 and BRCA2 testing.  Cervical Cancer Your health care provider may recommend that you be screened regularly for cancer of the pelvic organs (ovaries, uterus, and vagina). This screening involves a pelvic examination, including checking for microscopic changes to the surface of your cervix (Pap test). You may be encouraged to have this screening done every 3 years, beginning at age 65.  For women ages 54-65, health care providers may recommend pelvic exams and Pap testing every 3 years, or they may recommend the Pap and pelvic exam, combined with testing for human papilloma virus (HPV), every 5 years. Some types of HPV increase your risk of cervical cancer. Testing for HPV may also be done on women of any age with unclear Pap test results.  Other health care providers may not recommend any screening for nonpregnant women who are considered low risk for pelvic cancer and who do not have symptoms. Ask your health care provider if a screening pelvic exam is right for you.  If you have had past treatment for cervical cancer or a condition that could lead to cancer, you need Pap tests and screening for cancer for at least 20 years after your treatment. If Pap tests have been discontinued, your risk factors (such as having a new sexual partner) need to be reassessed to determine if screening should resume. Some women have medical problems that increase the chance of getting cervical cancer. In these cases, your health care provider may recommend more frequent screening and Pap tests.  Colorectal Cancer  This type of cancer can be detected and often prevented.  Routine colorectal cancer screening usually begins at 70 years of age and continues through 70 years of age.  Your health care provider may recommend screening at an earlier age if you have risk factors for colon cancer.  Your health care provider may also recommend using home test kits to check for hidden blood in  the stool.  A small camera at the end of a tube can be used to examine your colon directly (sigmoidoscopy or colonoscopy). This is done to check for the earliest forms of colorectal cancer.  Routine screening usually begins at age 75.  Direct examination of the colon should be repeated every 5-10 years through 70 years of age. However, you may need to be screened more often if early forms of precancerous polyps or small growths are found.  Skin Cancer  Check your skin from head to toe regularly.  Tell your health care provider about any new moles or changes in moles, especially if there is a change in a mole's shape or color.  Also tell your health care provider if you have a mole that is larger than the size of a pencil eraser.  Always use sunscreen. Apply sunscreen liberally and repeatedly throughout the day.  Protect yourself by wearing long sleeves, pants, a wide-brimmed hat, and sunglasses whenever you are outside.  Heart  disease, diabetes, and high blood pressure  High blood pressure causes heart disease and increases the risk of stroke. High blood pressure is more likely to develop in: ? People who have blood pressure in the high end of the normal range (130-139/85-89 mm Hg). ? People who are overweight or obese. ? People who are African American.  If you are 86-35 years of age, have your blood pressure checked every 3-5 years. If you are 73 years of age or older, have your blood pressure checked every year. You should have your blood pressure measured twice-once when you are at a hospital or clinic, and once when you are not at a hospital or clinic. Record the average of the two measurements. To check your blood pressure when you are not at a hospital or clinic, you can use: ? An automated blood pressure machine at a pharmacy. ? A home blood pressure monitor.  If you are between 28 years and 37 years old, ask your health care provider if you should take aspirin to prevent  strokes.  Have regular diabetes screenings. This involves taking a blood sample to check your fasting blood sugar level. ? If you are at a normal weight and have a low risk for diabetes, have this test once every three years after 69 years of age. ? If you are overweight and have a high risk for diabetes, consider being tested at a younger age or more often. Preventing infection Hepatitis B  If you have a higher risk for hepatitis B, you should be screened for this virus. You are considered at high risk for hepatitis B if: ? You were born in a country where hepatitis B is common. Ask your health care provider which countries are considered high risk. ? Your parents were born in a high-risk country, and you have not been immunized against hepatitis B (hepatitis B vaccine). ? You have HIV or AIDS. ? You use needles to inject street drugs. ? You live with someone who has hepatitis B. ? You have had sex with someone who has hepatitis B. ? You get hemodialysis treatment. ? You take certain medicines for conditions, including cancer, organ transplantation, and autoimmune conditions.  Hepatitis C  Blood testing is recommended for: ? Everyone born from [birth year] through 1963. ? Anyone with known risk factors for hepatitis C.  Sexually transmitted infections (STIs)  You should be screened for sexually transmitted infections (STIs) including gonorrhea and chlamydia if: ? You are sexually active and are younger than 70 years of age. ? You are older than 70 years of age and your health care provider tells you that you are at risk for this type of infection. ? Your sexual activity has changed since you were last screened and you are at an increased risk for chlamydia or gonorrhea. Ask your health care provider if you are at risk.  If you do not have HIV, but are at risk, it may be recommended that you take a prescription medicine daily to prevent HIV infection. This is called pre-exposure prophylaxis  (PrEP). You are considered at risk if: ? You are sexually active and do not regularly use condoms or know the HIV status of your partner(s). ? You take drugs by injection. ? You are sexually active with a partner who has HIV.  Talk with your health care provider about whether you are at high risk of being infected with HIV. If you choose to begin PrEP, you should first be tested for HIV. You  should then be tested every 3 months for as long as you are taking PrEP. Pregnancy  If you are premenopausal and you may become pregnant, ask your health care provider about preconception counseling.  If you may become pregnant, take 400 to 800 micrograms (mcg) of folic acid every day.  If you want to prevent pregnancy, talk to your health care provider about birth control (contraception). Osteoporosis and menopause  Osteoporosis is a disease in which the bones lose minerals and strength with aging. This can result in serious bone fractures. Your risk for osteoporosis can be identified using a bone density scan.  If you are 44 years of age or older, or if you are at risk for osteoporosis and fractures, ask your health care provider if you should be screened.  Ask your health care provider whether you should take a calcium or vitamin D supplement to lower your risk for osteoporosis.  Menopause may have certain physical symptoms and risks.  Hormone replacement therapy may reduce some of these symptoms and risks. Talk to your health care provider about whether hormone replacement therapy is right for you. Follow these instructions at home:  Schedule regular health, dental, and eye exams.  Stay current with your immunizations.  Do not use any tobacco products including cigarettes, chewing tobacco, or electronic cigarettes.  If you are pregnant, do not drink alcohol.  If you are breastfeeding, limit how much and how often you drink alcohol.  Limit alcohol intake to no more than 1 drink per day for  nonpregnant women. One drink equals 12 ounces of beer, 5 ounces of wine, or 1 ounces of hard liquor.  Do not use street drugs.  Do not share needles.  Ask your health care provider for help if you need support or information about quitting drugs.  Tell your health care provider if you often feel depressed.  Tell your health care provider if you have ever been abused or do not feel safe at home. This information is not intended to replace advice given to you by your health care provider. Make sure you discuss any questions you have with your health care provider. Document Released: 05/16/2011 Document Revised: 04/07/2016 Document Reviewed: 08/04/2015 Elsevier Interactive Patient Education  [employer].

## 2018-10-03 ENCOUNTER — Ambulatory Visit (INDEPENDENT_AMBULATORY_CARE_PROVIDER_SITE_OTHER): Payer: Medicare Other

## 2018-10-03 DIAGNOSIS — Z23 Encounter for immunization: Secondary | ICD-10-CM

## 2019-01-03 ENCOUNTER — Encounter: Payer: Self-pay | Admitting: Gastroenterology

## 2019-04-23 NOTE — Telephone Encounter (Signed)
I was out of office last week so apologies for late reply. Yes we can consider trying a medication like zoloft   But would like to have her set up for a virtual visit to discuss

## 2019-04-29 NOTE — Progress Notes (Signed)
Virtual Visit via Video Note  I connected with@ on 04/30/19 at  8:30 AM EDT by a video enabled telemedicine application and verified that I am speaking with the correct person using two identifiers. Location patient: home Location provider:work or home office Persons participating in the virtual visit: patient, provider Changed to telephone visit  After technical difficulties WIth national recommendations  regarding COVID 19 pandemic   video visit is advised over in office visit for this patient.  Patient aware  of the limitations of evaluation and management by telemedicine and  availability of in person appointments. and agreed to proceed.   HPI: Sheri Jensen presents for video visit  see message my chart about  malaise feel depression like   See messages .  Decrease  motivation to such things as decorate things she used to enjoy  But still ok with kids and eating  But  Lethargic  And not motivated  etoh 2 per night supp vit calicum d and tried melatonin but doesn know if helps  Sleep always problematic ( see past hxs)  No self harming  appetite good and bp stable.   Ha episode of severe vomiting in March wheil visiting brother  And feles malaise of a few weeks but no diarrhea fever or recurrance   ROS: See pertinent positives and negatives per HPI. No fever weight  loss abd pain  Past Medical History:  Diagnosis Date  . Depression   . Hyperlipidemia   . Hypertension     Past Surgical History:  Procedure Laterality Date  . chidbirth     x2  . COLONOSCOPY  03/00   2/10- normal     Family History  Problem Relation Age of Onset  . Crohn's disease Daughter   . Cancer Maternal Grandfather        colon  . Diabetes Paternal Grandfather   . Deafness Grandchild        congenital    Social History   Tobacco Use  . Smoking status: Never Smoker  . Smokeless tobacco: Never Used  Substance Use Topics  . Alcohol use: Yes    Comment: 2 glasses of wine daily   . Drug  use: No      Current Outpatient Medications:  .  calcium-vitamin D (OSCAL) 250-125 MG-UNIT per tablet, Take 1 tablet by mouth daily. Reported on 12/25/2015, Disp: , Rfl:  .  diphenhydrAMINE (BENADRYL) 25 MG tablet, Take 1 tablet by mouth as needed., Disp: , Rfl:  .  Multiple Vitamins-Minerals (WOMENS ONE DAILY PO), Take by mouth daily., Disp: , Rfl:  .  sertraline (ZOLOFT) 50 MG tablet, Take 25 mg per day po, increase to 50 mg per day after 1-2 weeks as directed, Disp: 30 tablet, Rfl: 1  EXAM: BP Readings from Last 3 Encounters:  08/06/18 118/68  08/04/17 138/68  10/12/16 132/80    VITALS per patient if applicable:  No exam failed  video visit   GENERAL: alert, oriented, appears well and in no acute distress  PSYCH/NEURO: pleasant and cooperative, no obvious depression or anxiety, speech and thought processing grossly intact Lab Results  Component Value Date   WBC 5.1 09/28/2016   HGB 13.3 09/28/2016   HCT 40.0 09/28/2016   PLT 283.0 09/28/2016   GLUCOSE 100 (H) 08/06/2018   CHOL 262 (H) 08/06/2018   TRIG 170.0 (H) 08/06/2018   HDL 94.30 08/06/2018   LDLDIRECT 109.9 06/11/2013   LDLCALC 134 (H) 08/06/2018   ALT 14 09/28/2016  AST 18 09/28/2016   NA 136 08/06/2018   K 4.7 08/06/2018   CL 97 08/06/2018   CREATININE 0.99 08/06/2018   BUN 20 08/06/2018   CO2 32 08/06/2018   TSH 1.74 09/28/2016    ASSESSMENT AND PLAN:  Discussed the following assessment and plan:    ICD-10-CM   1. Malaise  R53.81    poss  depression sx   2. Lack of motivation  Z91.89   3. Disturbance in sleep behavior  G47.9    Counseled.  Also   Poss dysthymia vs depression vs other   No alarm sx  Daughter  Has had success with sertraline plan 25 mg and inc to 50 as tolerated in 1-2 weeks plan rov or VV in about a month   Expectant management and discussion of plan and treatment with opportunity to ask questions and all were answered. The patient agreed with the plan and demonstrated an  understanding of the instructions.  Total visit 25mins > 50% spent counseling and coordinating care as indicated in above note and in instructions to patient .  Advised to call back or seek an in-person evaluation if worsening  or having  further concerns . In the interim   Berniece AndreasWanda , MD

## 2019-04-30 ENCOUNTER — Other Ambulatory Visit: Payer: Self-pay

## 2019-04-30 ENCOUNTER — Ambulatory Visit (INDEPENDENT_AMBULATORY_CARE_PROVIDER_SITE_OTHER): Payer: Medicare Other | Admitting: Internal Medicine

## 2019-04-30 ENCOUNTER — Encounter: Payer: Self-pay | Admitting: Internal Medicine

## 2019-04-30 DIAGNOSIS — Z9189 Other specified personal risk factors, not elsewhere classified: Secondary | ICD-10-CM

## 2019-04-30 DIAGNOSIS — G479 Sleep disorder, unspecified: Secondary | ICD-10-CM

## 2019-04-30 DIAGNOSIS — R5381 Other malaise: Secondary | ICD-10-CM

## 2019-04-30 MED ORDER — SERTRALINE HCL 50 MG PO TABS: ORAL_TABLET | ORAL | 1 refills | Status: DC

## 2019-06-28 ENCOUNTER — Other Ambulatory Visit: Payer: Self-pay | Admitting: Internal Medicine

## 2019-07-16 NOTE — Telephone Encounter (Signed)
Please send in  100 mg tabs  And take 1 po qd disp 90 ( remind her = 2 50 mg and we are increasing the mg of the tablet)   Then have her do a Virtual in  A month after the changes

## 2019-07-17 MED ORDER — SERTRALINE HCL 100 MG PO TABS: ORAL_TABLET | ORAL | 1 refills | Status: DC

## 2019-08-16 ENCOUNTER — Other Ambulatory Visit: Payer: Self-pay

## 2019-08-16 ENCOUNTER — Encounter: Payer: Self-pay | Admitting: Internal Medicine

## 2019-08-16 ENCOUNTER — Telehealth (INDEPENDENT_AMBULATORY_CARE_PROVIDER_SITE_OTHER): Payer: Medicare Other | Admitting: Internal Medicine

## 2019-08-16 DIAGNOSIS — M8580 Other specified disorders of bone density and structure, unspecified site: Secondary | ICD-10-CM

## 2019-08-16 DIAGNOSIS — Z79899 Other long term (current) drug therapy: Secondary | ICD-10-CM

## 2019-08-16 DIAGNOSIS — Z1211 Encounter for screening for malignant neoplasm of colon: Secondary | ICD-10-CM

## 2019-08-16 DIAGNOSIS — E785 Hyperlipidemia, unspecified: Secondary | ICD-10-CM | POA: Diagnosis not present

## 2019-08-16 DIAGNOSIS — F341 Dysthymic disorder: Secondary | ICD-10-CM | POA: Diagnosis not present

## 2019-08-16 DIAGNOSIS — R5381 Other malaise: Secondary | ICD-10-CM

## 2019-08-16 NOTE — Progress Notes (Signed)
Virtual Visit via Video Note  I connected with@ on 08/16/19 at  1:30 PM EDT by a video enabled telemedicine application and verified that I am speaking with the correct person using two identifiers. Location patient: home Location provider:work office Persons participating in the virtual visit: patient, provider  WIth national recommendations  regarding COVID 19 pandemic   video visit is advised over in office visit for this patient.  Patient aware  of the limitations of evaluation and management by telemedicine and  availability of in person appointments. and agreed to proceed.   HPI: Sheri Jensen presents for video visit     She is back on sertraline and now up to 100 mg per day x 3 weeks  and has more energy  In day    Not ack to baseline but better   .    Wine up to 3 per night in the evening when relaxing on the porch  No tob other  bp was 145/75  Has been better with lsi  Know due for mammo and colon cancer screening   ROS: See pertinent positives and negatives per HPI.  Past Medical History:  Diagnosis Date  . Depression   . Hyperlipidemia   . Hypertension     Past Surgical History:  Procedure Laterality Date  . chidbirth     x2  . COLONOSCOPY  03/00   2/10- normal     Family History  Problem Relation Age of Onset  . Crohn's disease Daughter   . Cancer Maternal Grandfather        colon  . Diabetes Paternal Grandfather   . Deafness Grandchild        congenital    Social History   Tobacco Use  . Smoking status: Never Smoker  . Smokeless tobacco: Never Used  Substance Use Topics  . Alcohol use: Yes    Comment: 2 glasses of wine daily   . Drug use: No      Current Outpatient Medications:  .  calcium-vitamin D (OSCAL) 250-125 MG-UNIT per tablet, Take 1 tablet by mouth daily. Reported on 12/25/2015, Disp: , Rfl:  .  diphenhydrAMINE (BENADRYL) 25 MG tablet, Take 1 tablet by mouth as needed., Disp: , Rfl:  .  Multiple Vitamins-Minerals (WOMENS ONE  DAILY PO), Take by mouth daily., Disp: , Rfl:  .  sertraline (ZOLOFT) 100 MG tablet, Take 1 tablet Daily if tolerated, Disp: 90 tablet, Rfl: 1  EXAM: BP Readings from Last 3 Encounters:  08/06/18 118/68  08/04/17 138/68  10/12/16 132/80    VITALS per patient if applicable:  GENERAL: alert, oriented, appears well and in no acute distress HEENT: atraumatic, conjunttiva clear, no obvious abnormalities on inspection of external nose and ears NECK: normal movements of the head and neck LUNGS: on inspection no signs of respiratory distress, breathing rate appears normal, no obvious gross SOB, gasping or wheezing CV: no obvious cyanosis MS: moves all visible extremities without noticeable abnormality PSYCH/NEURO: pleasant and cooperative, no obvious depression or anxiety, speech and thought processing grossly intact Lab Results  Component Value Date   WBC 5.1 09/28/2016   HGB 13.3 09/28/2016   HCT 40.0 09/28/2016   PLT 283.0 09/28/2016   GLUCOSE 100 (H) 08/06/2018   CHOL 262 (H) 08/06/2018   TRIG 170.0 (H) 08/06/2018   HDL 94.30 08/06/2018   LDLDIRECT 109.9 06/11/2013   LDLCALC 134 (H) 08/06/2018   ALT 14 09/28/2016   AST 18 09/28/2016   NA 136 08/06/2018  K 4.7 08/06/2018   CL 97 08/06/2018   CREATININE 0.99 08/06/2018   BUN 20 08/06/2018   CO2 32 08/06/2018   TSH 1.74 09/28/2016    ASSESSMENT AND PLAN:  Discussed the following assessment and plan:    ICD-10-CM   1. Dysthymia  F34.1   2. Malaise  A25.05 Basic metabolic panel    CBC with Differential/Platelet    Lipid panel    Hepatic function panel    TSH  3. Osteopenia, unspecified location  L97.67 Basic metabolic panel    CBC with Differential/Platelet    Lipid panel    Hepatic function panel    TSH  4. Hyperlipidemia, unspecified hyperlipidemia type  H41.9 Basic metabolic panel    CBC with Differential/Platelet    Lipid panel    Hepatic function panel    TSH  5. Medication management  F79.024 Basic  metabolic panel    CBC with Differential/Platelet    Lipid panel    Hepatic function panel    TSH  6. Screening for colon cancer  Z12.11 Cologuard    Get bp better  wlsi  Get mammogram   Get flu vaccine  Send order for cologuard   Stay oin sertraline 100   May continue to imoprove .  Plan fasting lab and then cpx  To review    Lipids etc.    Counseled.   Expectant management and discussion of plan and treatment with opportunity to ask questions and all were answered. The patient agreed with the plan and demonstrated an understanding of the instructions.  Advised to call back or seek an in-person evaluation if worsening  or having  further concerns .   Shanon Ace, MD

## 2019-09-25 DIAGNOSIS — Z1231 Encounter for screening mammogram for malignant neoplasm of breast: Secondary | ICD-10-CM | POA: Diagnosis not present

## 2019-09-25 DIAGNOSIS — R29890 Loss of height: Secondary | ICD-10-CM | POA: Diagnosis not present

## 2019-09-25 DIAGNOSIS — Z8262 Family history of osteoporosis: Secondary | ICD-10-CM | POA: Diagnosis not present

## 2019-09-25 DIAGNOSIS — M8589 Other specified disorders of bone density and structure, multiple sites: Secondary | ICD-10-CM | POA: Diagnosis not present

## 2019-09-25 LAB — HM DEXA SCAN

## 2019-09-25 LAB — HM MAMMOGRAPHY

## 2019-10-03 ENCOUNTER — Encounter: Payer: Self-pay | Admitting: Internal Medicine

## 2019-10-04 ENCOUNTER — Telehealth: Payer: Self-pay | Admitting: *Deleted

## 2019-10-04 DIAGNOSIS — Z1211 Encounter for screening for malignant neoplasm of colon: Secondary | ICD-10-CM

## 2019-10-04 NOTE — Telephone Encounter (Signed)
Copied from [place] [id number]. Topic: General - Other >> Oct 04, 2019 12:29 PM Leward Quan A wrote: Reason for CRM: Megan with [facility] with [facility] called to say that they received orders for patient on 09/05/2019 but is missing the doctors NPI. Asking for order to be resent with all information corrected. Ph# [phone]

## 2019-10-04 NOTE — Telephone Encounter (Signed)
Orders have been refaxed.

## 2019-11-26 ENCOUNTER — Telehealth: Payer: Self-pay | Admitting: *Deleted

## 2019-11-26 ENCOUNTER — Other Ambulatory Visit: Payer: Self-pay

## 2019-11-26 NOTE — Telephone Encounter (Signed)
Patient returned call to office to do screening. During screening, patient stated that she has had a head cold for 3 weeks. Patient informed that pcp needed to verify if it was okay for her to come into the office or if we needed to reschedule her. Patient notified that office will call her back concerning appointment. Please advise.

## 2019-11-26 NOTE — Telephone Encounter (Signed)
Please advise 

## 2019-11-26 NOTE — Telephone Encounter (Signed)
Lvm for pt to call back crm created okay to disclose  

## 2019-11-26 NOTE — Telephone Encounter (Signed)
Patient returned call to office. Note read to patient about doing virtual and having Covid testing. Patient stated that she would get testing done first, then do a virtual visit after receiving results. CPE appointment for 11/27/19 cancelled.

## 2019-11-26 NOTE — Telephone Encounter (Signed)
I don't have enough information   .   Should we . a virtual visit  About the  "cold for 3 weeks" and perhaps she should get a covid test.  She culd chose to be tested and do a virtual or virtual first

## 2019-11-27 ENCOUNTER — Encounter: Payer: Medicare Other | Admitting: Internal Medicine

## 2019-11-27 ENCOUNTER — Ambulatory Visit: Payer: Medicare Other | Attending: Internal Medicine

## 2019-11-27 DIAGNOSIS — Z20822 Contact with and (suspected) exposure to covid-19: Secondary | ICD-10-CM

## 2019-11-28 LAB — NOVEL CORONAVIRUS, NAA: SARS-CoV-2, NAA: NOT DETECTED

## 2019-12-03 ENCOUNTER — Ambulatory Visit: Payer: Medicare Other | Attending: Internal Medicine

## 2019-12-03 DIAGNOSIS — Z23 Encounter for immunization: Secondary | ICD-10-CM | POA: Insufficient documentation

## 2019-12-03 NOTE — Progress Notes (Signed)
   Cov[facility] Vaccination [facility]  Name:  Sheri Jensen    MRN: [id number] DOB: June 22, 1946  12/03/2019  Sheri Jensen was observed post Covid-19 immunization for 15 minutes without incidence. She was provided with Vaccine Information Sheet and instruction to access the V-Safe system.   Sheri Jensen was instructed to call 911 with any severe reactions post vaccine: Marland Kitchen Difficulty breathing  . Swelling of your face and throat  . A fast heartbeat  . A bad rash all over your body  . Dizziness and weakness    Immunizations Administered    Name Date Dose VIS Date Route   Pfizer COVID-19 Vaccine 12/03/2019  6:12 PM 0.3 mL 10/25/2019 Intramuscular   Manufacturer: [employer], [employer]   Lot: [id number]   NDC: [address]-6

## 2019-12-23 ENCOUNTER — Ambulatory Visit: Payer: Medicare Other | Attending: Internal Medicine

## 2019-12-23 DIAGNOSIS — Z23 Encounter for immunization: Secondary | ICD-10-CM

## 2019-12-23 NOTE — Progress Notes (Signed)
   Covid-19 Vaccination Clinic  Name:  Sheri Jensen    MRN: [id number] DOB: [birth year]  12/23/2019  [name] was observed post Covid-19 immunization for 15 minutes without incidence. She was provided with Vaccine Information Sheet and instruction to access the V-Safe system.   [name] was instructed to call 911 with any severe reactions post vaccine: Marland Kitchen Difficulty breathing  . Swelling of your face and throat  . A fast heartbeat  . A bad rash all over your body  . Dizziness and weakness    Immunizations Administered    Name Date Dose VIS Date Route   Pfizer COVID-19 Vaccine 12/23/2019 10:50 AM 0.3 mL 10/25/2019 Intramuscular   Manufacturer: [employer], [employer]   Lot: [id number]   NDC: [id number]

## 2020-01-06 NOTE — Progress Notes (Signed)
This visit occurred during the SARS-CoV-2 public health emergency.  Safety protocols were in place, including screening questions prior to the visit, additional usage of staff PPE, and extensive cleaning of exam room while observing appropriate contact time as indicated for disinfecting solutions.    Chief Complaint  Patient presents with  . Annual Exam    pt is concerned about hving no energy     HPI: Patient  Sheri Jensen  74 y.o. comes in today for Preventive Health Care visit   Had negative covid test.  Has  A list   Still has lack of interest in doing things has to lay down.  To get energy.   got sick in march 2020   Was very sick with vomiting diarrhea got better had negative Covid antibodies. Slept a lot .  Since her last video visit we restarted a medicine given sertraline 50 mg then 100 and she noted that it really did help her sleep however initially increased energy but now back to baseline except her sleep is continuing better.   Alcohol glass of wine she enjoys up to 3/day is now down to 2 and 1/day. The shutdown is been difficult she is not been exercising since November have been doing some in her home. She does look forward to grandchildren and friends. Gets frequent burping even with an apple forgets to take Pepcid before eating does not know if it helps anyway. She still thinks her balance is not quite as good did have one fall where she was juggling a number of groceries including a peanut butter jar and tripped going out of [employer].  No injury. When she gets up from a couch she noticed that she has to steady herself before she takes off but no specific musculoskeletal complaint. She has a little but urinary dribbling urgency getting to the bathroom but no fever or UTI symptoms   Health Maintenance  Topic Date Due  . COLONOSCOPY  12/30/2018  . TETANUS/TDAP  08/10/2020  . MAMMOGRAM  09/24/2021  . INFLUENZA VACCINE  Completed  . DEXA SCAN  Completed  . Hepatitis  C Screening  Completed  . PNA vac Low Risk Adult  Completed   Health Maintenance Review LIFESTYLE:  Exercise: Not recently Tobacco/ETS: no Alcohol: 1:59 at night used to be 3 Sugar beverages: No Sleep: sleeping better awakes at 8 am  Drug use: no HH of 2     ROS: See HPI GEN/ HEENT: No fever, significant weight changes sweats headaches vision problems hearing changes, CV/ PULM; No chest pain shortness of breath cough, syncope,edema  change in exercise tolerance. GI /GU: No adominal pain, vomiting, change in bowel habits. No blood in the stool. No significant GU symptoms. SKIN/HEME: ,no acute skin rashes suspicious lesions or bleeding. No lymphadenopathy, nodules, masses.  NEURO/ PSYCH:  No neurologic signs such as weakness numbness. IMM/ Allergy: No unusual infections.  Allergy .   REST of 12 system review negative except as per HPI   Past Medical History:  Diagnosis Date  . Depression   . Hyperlipidemia   . Hypertension     Past Surgical History:  Procedure Laterality Date  . chidbirth     x2  . COLONOSCOPY  03/00   2/10- normal     Family History  Problem Relation Age of Onset  . Crohn's disease Daughter   . Cancer Maternal Grandfather        colon  . Diabetes Paternal Grandfather   . Deafness Grandchild  congenital    Social History   Socioeconomic History  . Marital status: Married    Spouse name: Not on file  . Number of children: Not on file  . Years of education: Not on file  . Highest education level: Not on file  Occupational History  . Not on file  Tobacco Use  . Smoking status: Never Smoker  . Smokeless tobacco: Never Used  Substance and Sexual Activity  . Alcohol use: Yes    Comment: 2 glasses of wine daily   . Drug use: No  . Sexual activity: Not on file  Other Topics Concern  . Not on file  Social History Narrative   No tobacco    hh of 2   A cat    etoh 2-3 wine  per day    With meals    Married one son and one daughter    Exercise well.    Occupation:  Former Teacher, music Strain:   . Difficulty of Paying Living Expenses: Not on file  Food Insecurity:   . Worried About Programme researcher, broadcasting/film/video in the Last Year: Not on file  . Ran Out of Food in the Last Year: Not on file  Transportation Needs:   . Lack of Transportation (Medical): Not on file  . Lack of Transportation (Non-Medical): Not on file  Physical Activity:   . Days of Exercise per Week: Not on file  . Minutes of Exercise per Session: Not on file  Stress:   . Feeling of Stress : Not on file  Social Connections:   . Frequency of Communication with Friends and Family: Not on file  . Frequency of Social Gatherings with Friends and Family: Not on file  . Attends Religious Services: Not on file  . Active Member of Clubs or Organizations: Not on file  . Attends Banker Meetings: Not on file  . Marital Status: Not on file    Outpatient Medications Prior to Visit  Medication Sig Dispense Refill  . calcium-vitamin D (OSCAL) 250-125 MG-UNIT per tablet Take 1 tablet by mouth daily. Reported on 12/25/2015    . diphenhydrAMINE (BENADRYL) 25 MG tablet Take 1 tablet by mouth as needed.    . Multiple Vitamins-Minerals (WOMENS ONE DAILY PO) Take by mouth daily.    . sertraline (ZOLOFT) 100 MG tablet Take 1 tablet Daily if tolerated 90 tablet 1   No facility-administered medications prior to visit.     EXAM:  BP 122/76 (BP Location: Right Arm, Patient Position: Sitting, Cuff Size: Normal)   Pulse 86   Temp 97.8 F (36.6 C) (Temporal)   Ht 5\' 4"  (1.626 m)   Wt 111 lb 9.6 oz (50.6 kg)   SpO2 96%   BMI 19.16 kg/m   Body mass index is 19.16 kg/m. Wt Readings from Last 3 Encounters:  01/07/20 111 lb 9.6 oz (50.6 kg)  08/06/18 105 lb 6.4 oz (47.8 kg)  08/04/17 107 lb 9.6 oz (48.8 kg)    Physical Exam: Vital signs reviewed 08/06/17 is a well-developed well-nourished alert cooperative     who appearsr stated age in no acute distress.  HEENT: normocephalic atraumatic , Eyes: PERRL EOM's full, conjunctiva clear, Nares: paten,t no deformity discharge or tenderness., Ears: no deformity EAC's clear TMs with normal landmarks.  Ear piercing has sagged close to add mouth masked NECK: supple without masses, thyromegaly or bruits. CHEST/PULM:  Clear to auscultation and percussion breath  sounds equal no wheeze , rales or rhonchi. No chest wall deformities or tenderness. CV: PMI is nondisplaced, S1 S2 no gallops, murmurs, rubs. Peripheral pulses are full without delay.No JVD .  ABDOMEN: Bowel sounds normal nontender  No guard or rebound, no hepato splenomegal no CVA tenderness.  No hernia. Extremtities:  No clubbing cyanosis or edema, no acute joint swelling or redness no focal atrophy NEURO:  Oriented x3, cranial nerves 3-12 appear to be intact, no obvious focal weakness,gait within normal limits no abnormal reflexes or asymmetrical SKIN: No acute rashes normal turgor, color, no bruising or petechiae. PSYCH: Oriented, good eye contact, no obvious depression anxiety, cognition and judgment appear normal. LN: no cervical axillary inguinal adenopathy  Lab Results  Component Value Date   WBC 5.1 09/28/2016   HGB 13.3 09/28/2016   HCT 40.0 09/28/2016   PLT 283.0 09/28/2016   GLUCOSE 100 (H) 08/06/2018   CHOL 262 (H) 08/06/2018   TRIG 170.0 (H) 08/06/2018   HDL 94.30 08/06/2018   LDLDIRECT 109.9 06/11/2013   LDLCALC 134 (H) 08/06/2018   ALT 14 09/28/2016   AST 18 09/28/2016   NA 136 08/06/2018   K 4.7 08/06/2018   CL 97 08/06/2018   CREATININE 0.99 08/06/2018   BUN 20 08/06/2018   CO2 32 08/06/2018   TSH 1.74 09/28/2016    BP Readings from Last 3 Encounters:  01/07/20 122/76  08/06/18 118/68  08/04/17 138/68    Lab plan reviewed with patient   ASSESSMENT AND PLAN:  Discussed the following assessment and plan:    ICD-10-CM   1. Visit for preventive health examination   Z00.00   2. Medication management  R15.400 Basic metabolic panel    CBC with Differential/Platelet    Hemoglobin A1c    Hepatic function panel    Lipid panel    TSH    T4, free    T3, free    Vitamin B12    Vitamin B1    Folate    POCT Urinalysis Dipstick (Automated)    Celiac Disease Comprehensive Panel with Reflexes    VITAMIN D 25 Hydroxy (Vit-D Deficiency, Fractures)  3. Dysthymia  Q67.6 Basic metabolic panel    CBC with Differential/Platelet    Hemoglobin A1c    Hepatic function panel    Lipid panel    TSH    T4, free    T3, free    Vitamin B12    Vitamin B1    Folate    POCT Urinalysis Dipstick (Automated)    Celiac Disease Comprehensive Panel with Reflexes  4. Hyperlipidemia, unspecified hyperlipidemia type  P95.0 Basic metabolic panel    CBC with Differential/Platelet    Hemoglobin A1c    Hepatic function panel    Lipid panel    TSH    T4, free    T3, free    Vitamin B12    Vitamin B1    Folate    POCT Urinalysis Dipstick (Automated)    Celiac Disease Comprehensive Panel with Reflexes  5. Low energy  D32.67 Basic metabolic panel    CBC with Differential/Platelet    Hemoglobin A1c    Hepatic function panel    Lipid panel    TSH    T4, free    T3, free    Vitamin B12    Vitamin B1    Folate    POCT Urinalysis Dipstick (Automated)    Celiac Disease Comprehensive Panel with Reflexes    VITAMIN D 25 Hydroxy (  Vit-D Deficiency, Fractures)  6. Imbalance  R26.89 Basic metabolic panel    CBC with Differential/Platelet    Hemoglobin A1c    Hepatic function panel    Lipid panel    TSH    T4, free    T3, free    Vitamin B12    Vitamin B1    Folate    POCT Urinalysis Dipstick (Automated)    Celiac Disease Comprehensive Panel with Reflexes    VITAMIN D 25 Hydroxy (Vit-D Deficiency, Fractures)  7. GI symptom  R19.8 Basic metabolic panel    CBC with Differential/Platelet    Hemoglobin A1c    Hepatic function panel    Lipid panel    TSH    T4, free     T3, free    Vitamin B12    Vitamin B1    Folate    POCT Urinalysis Dipstick (Automated)    Celiac Disease Comprehensive Panel with Reflexes    VITAMIN D 25 Hydroxy (Vit-D Deficiency, Fractures)  8. Urinary incontinence, unspecified type  R32 Basic metabolic panel    CBC with Differential/Platelet    Hemoglobin A1c    Hepatic function panel    Lipid panel    TSH    T4, free    T3, free    Vitamin B12    Vitamin B1    Folate    POCT Urinalysis Dipstick (Automated)    Celiac Disease Comprehensive Panel with Reflexes  9. Osteopenia, unspecified location  M85.80 VITAMIN D 25 Hydroxy (Vit-D Deficiency, Fractures)   Has a number of complaints it is difficult to realize if her lack of energy is physical mental or both.  Certainly we discussed that alcohol and she will get down to 1 a day we will check vitamin levels check for neuropathy symptoms sertraline seem to seem to help her sleep consideration of increasing to 150. Getting back on balance exercises and general physical activity may be helpful consider referring to physical therapy for balance exercises she still is at a high level of mobility. In regard to her chronic GI symptoms is also unclear check celiac panel do lactose exclusion consider GI referral.  Her Cologuard got delayed for sample got messed up supposed to get a second.  No first-degree relative with colon cancer although grandparent did have this. She will try Kegel's exercises noninvasive for her bladder plus may be dietary changes consider other if needed.  Patient Care Team: Madelin Headings, MD as PCP - General Patient Instructions  Fasting labs  Plan fu depending on results. Limit cut back on alcohol Consider balance  physical therapy or  Restart exercises.  consider increasing  Sertraline to 150 mg .    Trial lactose free diet . consider seeing GI for further advice  Kegel exercise    Kegel Exercises  Kegel exercises can help strengthen your pelvic floor  muscles. The pelvic floor is a group of muscles that support your rectum, small intestine, and bladder. In females, pelvic floor muscles also help support the womb (uterus). These muscles help you control the flow of urine and stool. Kegel exercises are painless and simple, and they do not require any equipment. Your provider may suggest Kegel exercises to:  Improve bladder and bowel control.  Improve sexual response.  Improve weak pelvic floor muscles after surgery to remove the uterus (hysterectomy) or pregnancy (females).  Improve weak pelvic floor muscles after prostate gland removal or surgery (males). Kegel exercises involve squeezing your pelvic floor muscles,  which are the same muscles you squeeze when you try to stop the flow of urine or keep from passing gas. The exercises can be done while sitting, standing, or lying down, but it is best to vary your position. Exercises How to do Kegel exercises: 1. Squeeze your pelvic floor muscles tight. You should feel a tight lift in your rectal area. If you are a female, you should also feel a tightness in your vaginal area. Keep your stomach, buttocks, and legs relaxed. 2. Hold the muscles tight for up to 10 seconds. 3. Breathe normally. 4. Relax your muscles. 5. Repeat as told by your health care provider. Repeat this exercise daily as told by your health care provider. Continue to do this exercise for at least 4-6 weeks, or for as long as told by your health care provider. You may be referred to a physical therapist who can help you learn more about how to do Kegel exercises. Depending on your condition, your health care provider may recommend:  Varying how long you squeeze your muscles.  Doing several sets of exercises every day.  Doing exercises for several weeks.  Making Kegel exercises a part of your regular exercise routine. This information is not intended to replace advice given to you by your health care provider. Make sure you  discuss any questions you have with your health care provider. Document Revised: 06/20/2018 Document Reviewed: 06/20/2018 Elsevier Patient Education  2020 [employer].     Victor K. Jalyah Weinheimer M.D.

## 2020-01-07 ENCOUNTER — Other Ambulatory Visit: Payer: Self-pay

## 2020-01-07 ENCOUNTER — Ambulatory Visit (INDEPENDENT_AMBULATORY_CARE_PROVIDER_SITE_OTHER): Payer: Medicare Other | Admitting: Internal Medicine

## 2020-01-07 ENCOUNTER — Encounter: Payer: Self-pay | Admitting: Internal Medicine

## 2020-01-07 VITALS — BP 122/76 | HR 86 | Temp 97.8°F | Ht 64.0 in | Wt 111.6 lb

## 2020-01-07 DIAGNOSIS — R5383 Other fatigue: Secondary | ICD-10-CM | POA: Diagnosis not present

## 2020-01-07 DIAGNOSIS — R198 Other specified symptoms and signs involving the digestive system and abdomen: Secondary | ICD-10-CM

## 2020-01-07 DIAGNOSIS — E785 Hyperlipidemia, unspecified: Secondary | ICD-10-CM | POA: Diagnosis not present

## 2020-01-07 DIAGNOSIS — Z79899 Other long term (current) drug therapy: Secondary | ICD-10-CM

## 2020-01-07 DIAGNOSIS — Z0000 Encounter for general adult medical examination without abnormal findings: Secondary | ICD-10-CM

## 2020-01-07 DIAGNOSIS — M8580 Other specified disorders of bone density and structure, unspecified site: Secondary | ICD-10-CM

## 2020-01-07 DIAGNOSIS — F341 Dysthymic disorder: Secondary | ICD-10-CM | POA: Diagnosis not present

## 2020-01-07 DIAGNOSIS — R32 Unspecified urinary incontinence: Secondary | ICD-10-CM

## 2020-01-07 DIAGNOSIS — R2689 Other abnormalities of gait and mobility: Secondary | ICD-10-CM

## 2020-01-07 NOTE — Patient Instructions (Signed)
Fasting labs  Plan fu depending on results. Limit cut back on alcohol Consider balance  physical therapy or  Restart exercises.  consider increasing  Sertraline to 150 mg .    Trial lactose free diet . consider seeing GI for further advice  Kegel exercise    Kegel Exercises  Kegel exercises can help strengthen your pelvic floor muscles. The pelvic floor is a group of muscles that support your rectum, small intestine, and bladder. In females, pelvic floor muscles also help support the womb (uterus). These muscles help you control the flow of urine and stool. Kegel exercises are painless and simple, and they do not require any equipment. Your provider may suggest Kegel exercises to:  Improve bladder and bowel control.  Improve sexual response.  Improve weak pelvic floor muscles after surgery to remove the uterus (hysterectomy) or pregnancy (females).  Improve weak pelvic floor muscles after prostate gland removal or surgery (males). Kegel exercises involve squeezing your pelvic floor muscles, which are the same muscles you squeeze when you try to stop the flow of urine or keep from passing gas. The exercises can be done while sitting, standing, or lying down, but it is best to vary your position. Exercises How to do Kegel exercises: 1. Squeeze your pelvic floor muscles tight. You should feel a tight lift in your rectal area. If you are a female, you should also feel a tightness in your vaginal area. Keep your stomach, buttocks, and legs relaxed. 2. Hold the muscles tight for up to 10 seconds. 3. Breathe normally. 4. Relax your muscles. 5. Repeat as told by your health care provider. Repeat this exercise daily as told by your health care provider. Continue to do this exercise for at least 4-6 weeks, or for as long as told by your health care provider. You may be referred to a physical therapist who can help you learn more about how to do Kegel exercises. Depending on your condition,  your health care provider may recommend:  Varying how long you squeeze your muscles.  Doing several sets of exercises every day.  Doing exercises for several weeks.  Making Kegel exercises a part of your regular exercise routine. This information is not intended to replace advice given to you by your health care provider. Make sure you discuss any questions you have with your health care provider. Document Revised: 06/20/2018 Document Reviewed: 06/20/2018 Elsevier Patient Education  2020 [employer].

## 2020-01-14 ENCOUNTER — Other Ambulatory Visit: Payer: Self-pay

## 2020-01-14 ENCOUNTER — Other Ambulatory Visit (INDEPENDENT_AMBULATORY_CARE_PROVIDER_SITE_OTHER): Payer: Medicare Other

## 2020-01-14 DIAGNOSIS — R32 Unspecified urinary incontinence: Secondary | ICD-10-CM

## 2020-01-14 DIAGNOSIS — R5383 Other fatigue: Secondary | ICD-10-CM

## 2020-01-14 DIAGNOSIS — E785 Hyperlipidemia, unspecified: Secondary | ICD-10-CM | POA: Diagnosis not present

## 2020-01-14 DIAGNOSIS — R198 Other specified symptoms and signs involving the digestive system and abdomen: Secondary | ICD-10-CM | POA: Diagnosis not present

## 2020-01-14 DIAGNOSIS — M8580 Other specified disorders of bone density and structure, unspecified site: Secondary | ICD-10-CM

## 2020-01-14 DIAGNOSIS — F341 Dysthymic disorder: Secondary | ICD-10-CM

## 2020-01-14 DIAGNOSIS — R82998 Other abnormal findings in urine: Secondary | ICD-10-CM

## 2020-01-14 DIAGNOSIS — Z79899 Other long term (current) drug therapy: Secondary | ICD-10-CM

## 2020-01-14 DIAGNOSIS — R8290 Unspecified abnormal findings in urine: Secondary | ICD-10-CM

## 2020-01-14 DIAGNOSIS — R2689 Other abnormalities of gait and mobility: Secondary | ICD-10-CM

## 2020-01-14 LAB — CBC WITH DIFFERENTIAL/PLATELET
Basophils Absolute: 0 10*3/uL (ref 0.0–0.1)
Basophils Relative: 1 % (ref 0.0–3.0)
Eosinophils Absolute: 0.3 10*3/uL (ref 0.0–0.7)
Eosinophils Relative: 5.1 % — ABNORMAL HIGH (ref 0.0–5.0)
HCT: 35.4 % — ABNORMAL LOW (ref 36.0–46.0)
Hemoglobin: 12 g/dL (ref 12.0–15.0)
Lymphocytes Relative: 34.4 % (ref 12.0–46.0)
Lymphs Abs: 1.7 10*3/uL (ref 0.7–4.0)
MCHC: 34 g/dL (ref 30.0–36.0)
MCV: 97.4 fl (ref 78.0–100.0)
Monocytes Absolute: 0.4 10*3/uL (ref 0.1–1.0)
Monocytes Relative: 8.3 % (ref 3.0–12.0)
Neutro Abs: 2.5 10*3/uL (ref 1.4–7.7)
Neutrophils Relative %: 51.2 % (ref 43.0–77.0)
Platelets: 269 10*3/uL (ref 150.0–400.0)
RBC: 3.64 Mil/uL — ABNORMAL LOW (ref 3.87–5.11)
RDW: 13.6 % (ref 11.5–15.5)
WBC: 4.9 10*3/uL (ref 4.0–10.5)

## 2020-01-14 LAB — LIPID PANEL
Cholesterol: 239 mg/dL — ABNORMAL HIGH (ref 0–200)
HDL: 74.2 mg/dL (ref >39.00)
LDL Cholesterol: 130 mg/dL — ABNORMAL HIGH (ref 0–99)
NonHDL: 164.42
Total CHOL/HDL Ratio: 3
Triglycerides: 174 mg/dL — ABNORMAL HIGH (ref 0.0–149.0)
VLDL: 34.8 mg/dL (ref 0.0–40.0)

## 2020-01-14 LAB — POC URINALSYSI DIPSTICK (AUTOMATED)
Glucose, UA: NEGATIVE
Protein, UA: NEGATIVE
Spec Grav, UA: 1.015 (ref 1.010–1.025)
Urobilinogen, UA: 0.2 E.U./dL
pH, UA: 6.5 (ref 5.0–8.0)

## 2020-01-14 LAB — HEPATIC FUNCTION PANEL
ALT: 12 U/L (ref 0–35)
AST: 18 U/L (ref 0–37)
Albumin: 4.3 g/dL (ref 3.5–5.2)
Alkaline Phosphatase: 66 U/L (ref 39–117)
Bilirubin, Direct: 0.1 mg/dL (ref 0.0–0.3)
Total Bilirubin: 0.6 mg/dL (ref 0.2–1.2)
Total Protein: 7.1 g/dL (ref 6.0–8.3)

## 2020-01-14 LAB — VITAMIN B12: Vitamin B-12: 688 pg/mL (ref 211–911)

## 2020-01-14 LAB — BASIC METABOLIC PANEL
BUN: 15 mg/dL (ref 6–23)
CO2: 30 mEq/L (ref 19–32)
Calcium: 9.8 mg/dL (ref 8.4–10.5)
Chloride: 99 mEq/L (ref 96–112)
Creatinine, Ser: 0.74 mg/dL (ref 0.40–1.20)
GFR: 77.14 mL/min (ref >60.00)
Glucose, Bld: 100 mg/dL — ABNORMAL HIGH (ref 70–99)
Potassium: 3.8 mEq/L (ref 3.5–5.1)
Sodium: 137 mEq/L (ref 135–145)

## 2020-01-14 LAB — HEMOGLOBIN A1C: Hgb A1c MFr Bld: 5.4 % (ref 4.6–6.5)

## 2020-01-14 LAB — T4, FREE: Free T4: 0.69 ng/dL (ref 0.60–1.60)

## 2020-01-14 LAB — FOLATE: Folate: >23.7 ng/mL (ref >5.9)

## 2020-01-14 LAB — TSH: TSH: 2.37 u[IU]/mL (ref 0.35–4.50)

## 2020-01-14 LAB — T3, FREE: T3, Free: 3.2 pg/mL (ref 2.3–4.2)

## 2020-01-14 LAB — VITAMIN D 25 HYDROXY (VIT D DEFICIENCY, FRACTURES): VITD: 50.13 ng/mL (ref 30.00–100.00)

## 2020-01-15 ENCOUNTER — Other Ambulatory Visit: Payer: Self-pay | Admitting: Internal Medicine

## 2020-01-15 LAB — URINE CULTURE
MICRO NUMBER:: 10204726
SPECIMEN QUALITY:: ADEQUATE

## 2020-01-17 LAB — CELIAC DISEASE COMPREHENSIVE PANEL WITH REFLEXES
(tTG) Ab, IgA: 1 U/mL
Immunoglobulin A: 197 mg/dL (ref 70–320)

## 2020-01-17 LAB — VITAMIN B1: Vitamin B1 (Thiamine): 22 nmol/L (ref 8–30)

## 2020-01-20 NOTE — Progress Notes (Signed)
Labs are   stable  or normal except borderline hemoglobin but b12  and thyroid are normal. No explanation for  symptoms I advise   plan cbc diff  and getting ferritin and ibc panel  to check iron levels   Consider seeing neurology if on going balance feeling issues . ( let us know if  you want [place] to refer)  FU in 3 months virtual or other  If ongoing.  symptoms .

## 2020-01-27 ENCOUNTER — Telehealth: Payer: Self-pay | Admitting: Internal Medicine

## 2020-01-27 NOTE — Telephone Encounter (Signed)
Please order cbcdiff  Ferritin and IBC panel  For April Then 3 mos rov virtual ok

## 2020-01-27 NOTE — Telephone Encounter (Signed)
Please advise if we are doing the labs at pt 3 month f/u or sooner?  Per last result note:   "Labs are stable  or normal except borderline hemoglobin but b12  and thyroid are normal. No explanation for  symptoms I advise   plan cbc diff  and getting ferritin and ibc panel  to check iron levels  Consider seeing neurology if on going balance feeling issues . ( let us know if  you want [place] to refer)  FU in 3 months virtual or other  If ongoing.  symptoms . "

## 2020-01-27 NOTE — Telephone Encounter (Signed)
Pt would like to know if she needs to schedule a Hemoglobin? Thanks

## 2020-01-28 ENCOUNTER — Other Ambulatory Visit: Payer: Self-pay

## 2020-01-28 DIAGNOSIS — E611 Iron deficiency: Secondary | ICD-10-CM

## 2020-01-28 NOTE — Telephone Encounter (Signed)
Left message to return phone call.

## 2020-01-28 NOTE — Telephone Encounter (Signed)
Patient notified of update  and verbalized understanding.  Pt has been scheduled for both appt. And labs have been placed!. No further action needed at this time!

## 2020-01-31 ENCOUNTER — Other Ambulatory Visit: Payer: Self-pay

## 2020-02-03 ENCOUNTER — Other Ambulatory Visit (INDEPENDENT_AMBULATORY_CARE_PROVIDER_SITE_OTHER): Payer: Medicare Other

## 2020-02-03 DIAGNOSIS — E611 Iron deficiency: Secondary | ICD-10-CM | POA: Diagnosis not present

## 2020-02-03 LAB — CBC WITH DIFFERENTIAL/PLATELET
Basophils Absolute: 0.1 10*3/uL (ref 0.0–0.1)
Basophils Relative: 0.9 % (ref 0.0–3.0)
Eosinophils Absolute: 0.3 10*3/uL (ref 0.0–0.7)
Eosinophils Relative: 5.6 % — ABNORMAL HIGH (ref 0.0–5.0)
HCT: 37.9 % (ref 36.0–46.0)
Hemoglobin: 12.5 g/dL (ref 12.0–15.0)
Lymphocytes Relative: 34.3 % (ref 12.0–46.0)
Lymphs Abs: 1.9 10*3/uL (ref 0.7–4.0)
MCHC: 33 g/dL (ref 30.0–36.0)
MCV: 98.5 fl (ref 78.0–100.0)
Monocytes Absolute: 0.5 10*3/uL (ref 0.1–1.0)
Monocytes Relative: 8.1 % (ref 3.0–12.0)
Neutro Abs: 2.9 10*3/uL (ref 1.4–7.7)
Neutrophils Relative %: 51.1 % (ref 43.0–77.0)
Platelets: 272 10*3/uL (ref 150.0–400.0)
RBC: 3.84 Mil/uL — ABNORMAL LOW (ref 3.87–5.11)
RDW: 13.1 % (ref 11.5–15.5)
WBC: 5.7 10*3/uL (ref 4.0–10.5)

## 2020-02-03 LAB — IBC PANEL
Iron: 44 ug/dL (ref 42–145)
Saturation Ratios: 12.2 % — ABNORMAL LOW (ref 20.0–50.0)
Transferrin: 257 mg/dL (ref 212.0–360.0)

## 2020-02-03 LAB — FERRITIN: Ferritin: 22.5 ng/mL (ref 10.0–291.0)

## 2020-02-12 NOTE — Progress Notes (Signed)
Apology for late comment   no anemia but iron level is slightly low  can cause cold feeling  and some  mild fatigue .  I advise adding iron in diet or  in vitamin supplement as tolerated

## 2020-02-18 ENCOUNTER — Other Ambulatory Visit: Payer: Self-pay

## 2020-02-19 ENCOUNTER — Other Ambulatory Visit (INDEPENDENT_AMBULATORY_CARE_PROVIDER_SITE_OTHER): Payer: Medicare Other

## 2020-02-19 DIAGNOSIS — M8580 Other specified disorders of bone density and structure, unspecified site: Secondary | ICD-10-CM

## 2020-02-19 DIAGNOSIS — Z79899 Other long term (current) drug therapy: Secondary | ICD-10-CM | POA: Diagnosis not present

## 2020-02-19 DIAGNOSIS — E785 Hyperlipidemia, unspecified: Secondary | ICD-10-CM

## 2020-02-19 DIAGNOSIS — R5381 Other malaise: Secondary | ICD-10-CM | POA: Diagnosis not present

## 2020-02-19 LAB — CBC WITH DIFFERENTIAL/PLATELET
Basophils Absolute: 0 10*3/uL (ref 0.0–0.1)
Basophils Relative: 0.8 % (ref 0.0–3.0)
Eosinophils Absolute: 0.2 10*3/uL (ref 0.0–0.7)
Eosinophils Relative: 5.2 % — ABNORMAL HIGH (ref 0.0–5.0)
HCT: 37.7 % (ref 36.0–46.0)
Hemoglobin: 12.7 g/dL (ref 12.0–15.0)
Lymphocytes Relative: 36.7 % (ref 12.0–46.0)
Lymphs Abs: 1.6 10*3/uL (ref 0.7–4.0)
MCHC: 33.6 g/dL (ref 30.0–36.0)
MCV: 97.6 fl (ref 78.0–100.0)
Monocytes Absolute: 0.5 10*3/uL (ref 0.1–1.0)
Monocytes Relative: 10.4 % (ref 3.0–12.0)
Neutro Abs: 2.1 10*3/uL (ref 1.4–7.7)
Neutrophils Relative %: 46.9 % (ref 43.0–77.0)
Platelets: 254 10*3/uL (ref 150.0–400.0)
RBC: 3.86 Mil/uL — ABNORMAL LOW (ref 3.87–5.11)
RDW: 13.3 % (ref 11.5–15.5)
WBC: 4.4 10*3/uL (ref 4.0–10.5)

## 2020-02-19 LAB — TSH: TSH: 1.57 u[IU]/mL (ref 0.35–4.50)

## 2020-02-19 LAB — HEPATIC FUNCTION PANEL
ALT: 9 U/L (ref 0–35)
AST: 15 U/L (ref 0–37)
Albumin: 4.4 g/dL (ref 3.5–5.2)
Alkaline Phosphatase: 61 U/L (ref 39–117)
Bilirubin, Direct: 0.1 mg/dL (ref 0.0–0.3)
Total Bilirubin: 0.5 mg/dL (ref 0.2–1.2)
Total Protein: 6.8 g/dL (ref 6.0–8.3)

## 2020-02-19 LAB — BASIC METABOLIC PANEL
BUN: 15 mg/dL (ref 6–23)
CO2: 31 mEq/L (ref 19–32)
Calcium: 9.5 mg/dL (ref 8.4–10.5)
Chloride: 102 mEq/L (ref 96–112)
Creatinine, Ser: 0.77 mg/dL (ref 0.40–1.20)
GFR: 73.66 mL/min (ref >60.00)
Glucose, Bld: 95 mg/dL (ref 70–99)
Potassium: 4.7 mEq/L (ref 3.5–5.1)
Sodium: 139 mEq/L (ref 135–145)

## 2020-02-19 LAB — LIPID PANEL
Cholesterol: 238 mg/dL — ABNORMAL HIGH (ref 0–200)
HDL: 88.7 mg/dL (ref >39.00)
LDL Cholesterol: 126 mg/dL — ABNORMAL HIGH (ref 0–99)
NonHDL: 149.77
Total CHOL/HDL Ratio: 3
Triglycerides: 118 mg/dL (ref 0.0–149.0)
VLDL: 23.6 mg/dL (ref 0.0–40.0)

## 2020-02-25 NOTE — Progress Notes (Signed)
I am confused about why these labs were repeated   a month after the first  I dont believe I asked to have them repeated .  They are mostly the same results .  Please advise

## 2020-04-24 ENCOUNTER — Other Ambulatory Visit: Payer: Self-pay

## 2020-04-24 ENCOUNTER — Telehealth: Payer: Self-pay

## 2020-04-24 ENCOUNTER — Telehealth (INDEPENDENT_AMBULATORY_CARE_PROVIDER_SITE_OTHER): Payer: Medicare Other | Admitting: Internal Medicine

## 2020-04-24 ENCOUNTER — Encounter: Payer: Self-pay | Admitting: Internal Medicine

## 2020-04-24 VITALS — BP 143/73 | Ht 64.0 in

## 2020-04-24 DIAGNOSIS — R2689 Other abnormalities of gait and mobility: Secondary | ICD-10-CM

## 2020-04-24 DIAGNOSIS — Z9181 History of falling: Secondary | ICD-10-CM

## 2020-04-24 DIAGNOSIS — R198 Other specified symptoms and signs involving the digestive system and abdomen: Secondary | ICD-10-CM | POA: Diagnosis not present

## 2020-04-24 DIAGNOSIS — R5383 Other fatigue: Secondary | ICD-10-CM

## 2020-04-24 DIAGNOSIS — Z79899 Other long term (current) drug therapy: Secondary | ICD-10-CM

## 2020-04-24 NOTE — Telephone Encounter (Signed)
Sent cologuard order form for patient today. Fax confirmed.

## 2020-04-24 NOTE — Progress Notes (Signed)
Virtual Visit via Video Note  I connected with@ on 04/24/20 at  1:30 PM EDT by a video enabled telemedicine application and verified that I am speaking with the correct person using two identifiers. Location patient: home Location provider:work  office Persons participating in the virtual visit: patient, provider  [place] national recommendations  regarding COVID 19 pandemic   video visit is advised over in office visit for this patient.  Patient aware  of the limitations of evaluation and management by telemedicine and  availability of in person appointments. and agreed to proceed.   HPI: Sheri Jensen presents for video visit  Fu meds and  Issues  See last note 2 21  Tired  I and low energy some better   But   Has had imbalance  Usually when needs to eat ( but no skipped meals)  And hx of falling  With this no iunjury  No loc dizziness arrhythmia sx neuro sx  Numbness  Gi sx off and on  fo a liong time burping no blood change in bowel habits   colo guard collected but didn't get sent off    Needs reeorder  etoh Trying to get to one  Per day  Never more than 2-3   143/73 range recently   ROS: See pertinent positives and negatives per HPI.  Past Medical History:  Diagnosis Date  . Depression   . Hyperlipidemia   . Hypertension     Past Surgical History:  Procedure Laterality Date  . chidbirth     x2  . COLONOSCOPY  03/00   2/10- normal     Family History  Problem Relation Age of Onset  . Crohn's disease Daughter   . Cancer Maternal Grandfather        colon  . Diabetes Paternal Grandfather   . Deafness Grandchild        congenital    Social History   Tobacco Use  . Smoking status: Never Smoker  . Smokeless tobacco: Never Used  Vaping Use  . Vaping Use: Never used  Substance Use Topics  . Alcohol use: Yes    Comment: 2 glasses of wine daily   . Drug use: No      Current Outpatient Medications:  .  calcium-vitamin D (OSCAL) 250-125 MG-UNIT per tablet,  Take 1 tablet by mouth daily. Reported on 12/25/2015, Disp: , Rfl:  .  diphenhydrAMINE (BENADRYL) 25 MG tablet, Take 1 tablet by mouth as needed., Disp: , Rfl:  .  Multiple Vitamins-Minerals (WOMENS ONE DAILY PO), Take by mouth daily., Disp: , Rfl:   EXAM: BP Readings from Last 3 Encounters:  04/24/20 (!) 143/73  01/07/20 122/76  08/06/18 118/68    VITALS per patient if applicable:  GENERAL: alert, oriented, appears well and in no acute distress  HEENT: atraumatic, conjunttiva clear, no obvious abnormalities on inspection of external nose and ears  NECK: normal movements of the head and neck  LUNGS: on inspection no signs of respiratory distress, breathing rate appears normal, no obvious gross SOB, gasping or wheezing  CV: no obvious cyanosis  MS: moves all visible extremities without noticeable abnormality  PSYCH/NEURO: pleasant and cooperative, no obvious depression or anxiety, speech and thought processing grossly intact Lab Results  Component Value Date   WBC 4.4 02/19/2020   HGB 12.7 02/19/2020   HCT 37.7 02/19/2020   PLT 254.0 02/19/2020   GLUCOSE 95 02/19/2020   CHOL 238 (H) 02/19/2020   TRIG 118.0 02/19/2020   HDL  88.70 02/19/2020   LDLDIRECT 109.9 06/11/2013   LDLCALC 126 (H) 02/19/2020   ALT 9 02/19/2020   AST 15 02/19/2020   NA 139 02/19/2020   K 4.7 02/19/2020   CL 102 02/19/2020   CREATININE 0.77 02/19/2020   BUN 15 02/19/2020   CO2 31 02/19/2020   TSH 1.57 02/19/2020   HGBA1C 5.4 01/14/2020    ASSESSMENT AND PLAN:  Discussed the following assessment and plan:    ICD-10-CM   1. Medication management  Z79.899   2. Low energy  R53.83    has imporved  but now off med sertaline and thinks may need another med option  3. GI symptom  R19.8 Ambulatory referral to Gastroenterology   upper burping on going  no pain weight loss  Dr Juanda Chance was gi in past  will refer  4. Imbalance  R26.89   5. History of falling  Z91.81    i fhungry and "imbalance " no  injury   Lipids ok labs stable   Counseled.   Since off stay off sertraline for another  3 weeks or so   Contact me about progress imbalance  Sx , energy level and  Falling and bp readings  Then go from there   Odd balance seems to be  When hungry but not skipping meals  But balance like drunk but no diplopia sweating dizziness  Or focal sx and better with eating  . Curious    Not related to fasting or etoh intake .  Suppose could be med so trial off  Before beginning another med    consider further eval if ongoing   Expectant management and discussion of plan and treatment with opportunity to ask questions and all were answered. The patient agreed with the plan and demonstrated an understanding of the instructions.  colo guard re order  Advised to call back or seek an in-person evaluation if worsening  or having  further concerns . Return for progress in about 3 weeks and then med plans from there .  Record revewiew has had dec iron sat nl ferritin  And hg  Outside external source  DATA REVIEWED:   Total time on date  of service including record review ordering and plan of care:  30 minutes   Berniece Andreas, MD  Lab on 02/19/2020  Component Date Value Ref Range Status  . TSH 02/19/2020 1.57  0.35 - 4.50 uIU/mL Final  . Total Bilirubin 02/19/2020 0.5  0.2 - 1.2 mg/dL Final  . Bilirubin, Direct 02/19/2020 0.1  0.0 - 0.3 mg/dL Final  . Alkaline Phosphatase 02/19/2020 61  39 - 117 U/L Final  . AST 02/19/2020 15  0 - 37 U/L Final  . ALT 02/19/2020 9  0 - 35 U/L Final  . Total Protein 02/19/2020 6.8  6.0 - 8.3 g/dL Final  . Albumin 30/07/2329 4.4  3.5 - 5.2 g/dL Final  . Cholesterol 07/62/2633 238* 0 - 200 mg/dL Final  . Triglycerides 02/19/2020 118.0  0 - 149 mg/dL Final  . HDL 35/45/6256 88.70  >39.00 mg/dL Final  . VLDL 38/93/7342 23.6  0.0 - 40.0 mg/dL Final  . LDL Cholesterol 02/19/2020 126* 0 - 99 mg/dL Final  . Total CHOL/HDL Ratio 02/19/2020 3   Final  . NonHDL 02/19/2020 149.77    Final  . WBC 02/19/2020 4.4  4.0 - 10.5 K/uL Final  . RBC 02/19/2020 3.86* 3.87 - 5.11 Mil/uL Final  . Hemoglobin 02/19/2020 12.7  12.0 - 15.0 g/dL  Final  . HCT 02/19/2020 37.7  36 - 46 % Final  . MCV 02/19/2020 97.6  78.0 - 100.0 fl Final  . MCHC 02/19/2020 33.6  30.0 - 36.0 g/dL Final  . RDW 02/19/2020 13.3  11.5 - 15.5 % Final  . Platelets 02/19/2020 254.0  150 - 400 K/uL Final  . Neutrophils Relative % 02/19/2020 46.9  43 - 77 % Final  . Lymphocytes Relative 02/19/2020 36.7  12 - 46 % Final  . Monocytes Relative 02/19/2020 10.4  3 - 12 % Final  . Eosinophils Relative 02/19/2020 5.2* 0 - 5 % Final  . Basophils Relative 02/19/2020 0.8  0 - 3 % Final  . Neutro Abs 02/19/2020 2.1  1.4 - 7.7 K/uL Final  . Lymphs Abs 02/19/2020 1.6  0.7 - 4.0 K/uL Final  . Monocytes Absolute 02/19/2020 0.5  0 - 1 K/uL Final  . Eosinophils Absolute 02/19/2020 0.2  0 - 0 K/uL Final  . Basophils Absolute 02/19/2020 0.0  0 - 0 K/uL Final  . Sodium 02/19/2020 139  135 - 145 mEq/L Final  . Potassium 02/19/2020 4.7  3.5 - 5.1 mEq/L Final  . Chloride 02/19/2020 102  96 - 112 mEq/L Final  . CO2 02/19/2020 31  19 - 32 mEq/L Final  . Glucose, Bld 02/19/2020 95  70 - 99 mg/dL Final  . BUN 02/19/2020 15  6 - 23 mg/dL Final  . Creatinine, Ser 02/19/2020 0.77  0.40 - 1.20 mg/dL Final  . GFR 02/19/2020 73.66  >60.00 mL/min Final  . Calcium 02/19/2020 9.5  8.4 - 10.5 mg/dL Final  Lab on 02/03/2020  Component Date Value Ref Range Status  . Ferritin 02/03/2020 22.5  10.0 - 291.0 ng/mL Final  . Iron 02/03/2020 44  42 - 145 ug/dL Final  . Transferrin 02/03/2020 257.0  212.0 - 360.0 mg/dL Final  . Saturation Ratios 02/03/2020 12.2* 20.0 - 50.0 % Final  . WBC 02/03/2020 5.7  4.0 - 10.5 K/uL Final  . RBC 02/03/2020 3.84* 3.87 - 5.11 Mil/uL Final  . Hemoglobin 02/03/2020 12.5  12.0 - 15.0 g/dL Final  . HCT 02/03/2020 37.9  36 - 46 % Final  . MCV 02/03/2020 98.5  78.0 - 100.0 fl Final  . MCHC 02/03/2020 33.0  30.0 -  36.0 g/dL Final  . RDW 02/03/2020 13.1  11.5 - 15.5 % Final  . Platelets 02/03/2020 272.0  150 - 400 K/uL Final  . Neutrophils Relative % 02/03/2020 51.1  43 - 77 % Final  . Lymphocytes Relative 02/03/2020 34.3  12 - 46 % Final  . Monocytes Relative 02/03/2020 8.1  3 - 12 % Final  . Eosinophils Relative 02/03/2020 5.6* 0 - 5 % Final  . Basophils Relative 02/03/2020 0.9  0 - 3 % Final  . Neutro Abs 02/03/2020 2.9  1.4 - 7.7 K/uL Final  . Lymphs Abs 02/03/2020 1.9  0.7 - 4.0 K/uL Final  . Monocytes Absolute 02/03/2020 0.5  0 - 1 K/uL Final  . Eosinophils Absolute 02/03/2020 0.3  0 - 0 K/uL Final  . Basophils Absolute 02/03/2020 0.1  0 - 0 K/uL Final  Lab on 01/14/2020  Component Date Value Ref Range Status  . VITD 01/14/2020 50.13  30.00 - 100.00 ng/mL Final  . INTERPRETATION 01/14/2020    Final  . (tTG) Ab, IgA 01/14/2020 1  U/mL Final  . Immunoglobulin A 01/14/2020 197  70 - 320 mg/dL Final  . Color, UA 01/14/2020 YELLOW   Final  .  Clarity, UA 01/14/2020 CLEAR   Final  . Glucose, UA 01/14/2020 Negative  Negative Final  . Bilirubin, UA 01/14/2020 -   Final  . Ketones, UA 01/14/2020 -   Final  . Spec Grav, UA 01/14/2020 1.015  1.010 - 1.025 Final  . Blood, UA 01/14/2020 -   Final  . pH, UA 01/14/2020 6.5  5.0 - 8.0 Final  . Protein, UA 01/14/2020 Negative  Negative Final  . Urobilinogen, UA 01/14/2020 0.2  0.2 or 1.0 E.U./dL Final  . Nitrite, UA 91/47/829503/12/2019 -   Final  . Leukocytes, UA 01/14/2020 Moderate (2+)* Negative Final  . Folate 01/14/2020 >23.7  >5.9 ng/mL Final  . Vitamin B1 (Thiamine) 01/14/2020 22  8 - 30 nmol/L Final  . Vitamin B-12 01/14/2020 688  211 - 911 pg/mL Final  . T3, Free 01/14/2020 3.2  2.3 - 4.2 pg/mL Final  . Free T4 01/14/2020 0.69  0.60 - 1.60 ng/dL Final  . TSH 62/13/086503/12/2019 2.37  0.35 - 4.50 uIU/mL Final  . Cholesterol 01/14/2020 239* 0 - 200 mg/dL Final  . Triglycerides 01/14/2020 174.0* 0 - 149 mg/dL Final  . HDL 78/46/962903/12/2019 74.20  >39.00 mg/dL Final  .  VLDL 52/84/132403/12/2019 34.8  0.0 - 40.0 mg/dL Final  . LDL Cholesterol 01/14/2020 130* 0 - 99 mg/dL Final  . Total CHOL/HDL Ratio 01/14/2020 3   Final  . NonHDL 01/14/2020 164.42   Final  . Total Bilirubin 01/14/2020 0.6  0.2 - 1.2 mg/dL Final  . Bilirubin, Direct 01/14/2020 0.1  0.0 - 0.3 mg/dL Final  . Alkaline Phosphatase 01/14/2020 66  39 - 117 U/L Final  . AST 01/14/2020 18  0 - 37 U/L Final  . ALT 01/14/2020 12  0 - 35 U/L Final  . Total Protein 01/14/2020 7.1  6.0 - 8.3 g/dL Final  . Albumin 40/10/272503/12/2019 4.3  3.5 - 5.2 g/dL Final  . Hgb D6UA1c MFr Bld 01/14/2020 5.4  4.6 - 6.5 % Final  . WBC 01/14/2020 4.9  4.0 - 10.5 K/uL Final  . RBC 01/14/2020 3.64* 3.87 - 5.11 Mil/uL Final  . Hemoglobin 01/14/2020 12.0  12.0 - 15.0 g/dL Final  . HCT 44/03/474203/12/2019 35.4* 36 - 46 % Final  . MCV 01/14/2020 97.4  78.0 - 100.0 fl Final  . MCHC 01/14/2020 34.0  30.0 - 36.0 g/dL Final  . RDW 59/56/387503/12/2019 13.6  11.5 - 15.5 % Final  . Platelets 01/14/2020 269.0  150 - 400 K/uL Final  . Neutrophils Relative % 01/14/2020 51.2  43 - 77 % Final  . Lymphocytes Relative 01/14/2020 34.4  12 - 46 % Final  . Monocytes Relative 01/14/2020 8.3  3 - 12 % Final  . Eosinophils Relative 01/14/2020 5.1* 0 - 5 % Final  . Basophils Relative 01/14/2020 1.0  0 - 3 % Final  . Neutro Abs 01/14/2020 2.5  1.4 - 7.7 K/uL Final  . Lymphs Abs 01/14/2020 1.7  0.7 - 4.0 K/uL Final  . Monocytes Absolute 01/14/2020 0.4  0 - 1 K/uL Final  . Eosinophils Absolute 01/14/2020 0.3  0 - 0 K/uL Final  . Basophils Absolute 01/14/2020 0.0  0 - 0 K/uL Final  . Sodium 01/14/2020 137  135 - 145 mEq/L Final  . Potassium 01/14/2020 3.8  3.5 - 5.1 mEq/L Final  . Chloride 01/14/2020 99  96 - 112 mEq/L Final  . CO2 01/14/2020 30  19 - 32 mEq/L Final  . Glucose, Bld 01/14/2020 100* 70 - 99 mg/dL Final  .  BUN 01/14/2020 15  6 - 23 mg/dL Final  . Creatinine, Ser 01/14/2020 0.74  0.40 - 1.20 mg/dL Final  . GFR 98/92/1194 77.14  >60.00 mL/min Final  . Calcium  01/14/2020 9.8  8.4 - 10.5 mg/dL Final  . MICRO NUMBER: 01/14/2020 [id number]   Final  . SPECIMEN QUALITY: 01/14/2020 Adequate   Final  . Sample Source 01/14/2020 URINE   Final  . STATUS: 01/14/2020 FINAL   Final  . ISOLATE 1: 01/14/2020    Final                   Value:Growth of mixed flora was isolated, suggesting probable contamination. No further testing will be performed. If clinically indicated, recollection using a method to minimize contamination, with prompt transfer to Urine Culture Transport Tube, is  recommended.   Lab on 11/27/2019  Component Date Value Ref Range Status  . SARS-CoV-2, NAA 11/27/2019 Not Detected  Not Detected Final  {

## 2020-05-11 DIAGNOSIS — Z1212 Encounter for screening for malignant neoplasm of rectum: Secondary | ICD-10-CM | POA: Diagnosis not present

## 2020-05-11 DIAGNOSIS — Z1211 Encounter for screening for malignant neoplasm of colon: Secondary | ICD-10-CM | POA: Diagnosis not present

## 2020-05-11 LAB — COLOGUARD: Cologuard: NEGATIVE

## 2020-05-13 LAB — COLOGUARD: COLOGUARD: NEGATIVE

## 2020-05-14 ENCOUNTER — Telehealth: Payer: Self-pay

## 2020-05-14 NOTE — Telephone Encounter (Signed)
Called patient and left a detailed voice message for her to call us back so I can give her the Cologuard results which is Negative.

## 2020-05-26 ENCOUNTER — Encounter: Payer: Self-pay | Admitting: Gastroenterology

## 2020-05-26 NOTE — Telephone Encounter (Signed)
Called patient back and gave her the message. Patient verbalized an understanding.

## 2020-05-26 NOTE — Telephone Encounter (Signed)
Patient returned Megan's called

## 2020-06-03 ENCOUNTER — Telehealth: Payer: Self-pay | Admitting: Internal Medicine

## 2020-06-03 NOTE — Telephone Encounter (Signed)
Left message for patient to schedule Annual Wellness Visit.  Please schedule with Nurse Health Advisor Shannon Crews, RN at [facility]  

## 2020-07-27 NOTE — Telephone Encounter (Signed)
We could restart the Wellbutrin but need to   Make video appt   To assess  Since we are well over 3-4 weeks from last appt.

## 2020-07-29 ENCOUNTER — Ambulatory Visit: Payer: Medicare Other | Admitting: Gastroenterology

## 2020-07-29 ENCOUNTER — Encounter: Payer: Self-pay | Admitting: Gastroenterology

## 2020-07-29 VITALS — BP 130/76 | HR 82 | Ht 64.0 in | Wt 113.0 lb

## 2020-07-29 DIAGNOSIS — K5900 Constipation, unspecified: Secondary | ICD-10-CM

## 2020-07-29 DIAGNOSIS — R142 Eructation: Secondary | ICD-10-CM | POA: Diagnosis not present

## 2020-07-29 MED ORDER — PANTOPRAZOLE SODIUM 40 MG PO TBEC: 40.0000 mg | DELAYED_RELEASE_TABLET | Freq: Every day | ORAL | 3 refills | Status: DC

## 2020-07-29 NOTE — Progress Notes (Addendum)
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 Can be associated with reflux and functional dyspepsia. Although other etiologies must be considered. She is reluctant, although not opposed, to endoscopy for further evaluation. Atypical presentation for symptomatic constipation. Anxiety may be contributing.   Discussed diaphragmatic breathing. Will trial PPI and FDGard - taking each for at least 2 weeks. If no response, with proceed with CXR EGD +/- manometry. She may benefit from cognitive behavioral therapy +/- anxiety treatment.   Consider baclofen 10 mg TID if symptoms persist.   I recommended that she add a daily dose of Citrucel to try to improve her constipation, with the hope that this would cause less bloating than psyllium.  PLAN: - Avoid carbonation, chewing gum, and eating too quickly - Trial of diaphragmatic breathing - information and video link provided - Trial of Pantoprazole 40 mg QAM x 2 weeks, then Trial of FDGuard (samples provided) x 2 weeks - Follow-up in 6 weeks, earlier if needed  Please see the "Patient Instructions" section for addition details about the plan.  I spent 45 minutes, including in depth chart review, independent review of results, communicating results with the patient directly, face-to-face time with the patient, coordinating care, ordering studies and medications as appropriate, and documentation.   HPI: Sheri Jensen is a 74 y.o. female referred by Dr. Fabian Sharp for eructations. The history is obtained through the patient and review of her electronic health record. She has seen Dr. Juanda Chance in the past, last in 2010. She is a retired Teacher, early years/pre. She has a history  of anxiety, depression, and hypertension.   Seen today for a several years history of post-prandial eructation, that has become more bothersome to she and her husband over the last 2-3 years. Occurs immediately after eating. Resolves within minutes and is slightly better if she sits upright while eating. No malodor. Less flatus. No distension, nausea, heart burn, reflux, regurgitation, early satiety, dysphagia, or odynophagia. Weight is stable if not increased. She does not chew gum, smoke, drink carbonated beverages, or eat quickly gulping foods and liquids. Embarrassed by the symptoms, although they do not prevent her from eating out or with other people.    She frequent sighs with stress and anxiety.  She wonders if being less anxious would help.  Ongoing fatigue and poor motivation. Only wants to sit or lie. Doesn't have the energy to do anything.   Trial of Pepcid provided no significant relief.   Has a bowel movement. Will goes days between bowel movements. Will use Colace for relief. No blood or mucous. Sense of complete evacuation.    She remembers her father burping frequently.   Celiac panel negative 01/2020 Ferritin, iron, and hemoglobin normal 01/2020  Abdominal ultrasound obtained for nausea, intractable vomiting, and eructation in 2017 was normal.   Normal colonoscopy 2000 and  2010. Cologard negative 04/2020  No known family history of colon cancer or polyps. No family history of uterine/endometrial cancer, pancreatic cancer or gastric/stomach cancer.   Past Medical History:  Diagnosis Date  . Depression   . Hyperlipidemia   . Hypertension     Past Surgical History:  Procedure Laterality Date  . chidbirth     x2  .  COLONOSCOPY  03/00   2/10- normal     Current Outpatient Medications  Medication Sig Dispense Refill  . acetaminophen (TYLENOL) 325 MG tablet Take 650 mg by mouth every 6 (six) hours as needed.    . calcium-vitamin D (OSCAL) 250-125 MG-UNIT per tablet  Take 1 tablet by mouth daily. Reported on 12/25/2015    . Chlorphen-Pseudoephed-APAP (CORICIDIN D PO) Take by mouth as needed.    . diphenhydrAMINE (BENADRYL) 25 MG tablet Take 1 tablet by mouth as needed.    Marland Kitchen DM-Doxylamine-Acetaminophen (NYQUIL COLD & FLU PO) Take by mouth as needed.    . famotidine (PEPCID) 20 MG tablet Take 20 mg by mouth daily as needed for heartburn or indigestion.    Marland Kitchen ibuprofen (ADVIL) 100 MG tablet Take 100 mg by mouth every 6 (six) hours as needed for fever.    . Melatonin 5 MG CAPS Take by mouth at bedtime as needed.    . Multiple Vitamins-Minerals (WOMENS ONE DAILY PO) Take by mouth daily.    . Simethicone 125 MG CAPS Take by mouth as needed.     No current facility-administered medications for this visit.    Allergies as of 07/29/2020  . (No Known Allergies)    Family History  Problem Relation Age of Onset  . Crohn's disease Daughter   . Cancer Maternal Grandfather        colon  . Diabetes Paternal Grandfather   . Deafness Grandchild        congenital    Social History   Socioeconomic History  . Marital status: Married    Spouse name: Not on file  . Number of children: Not on file  . Years of education: Not on file  . Highest education level: Not on file  Occupational History  . Not on file  Tobacco Use  . Smoking status: Never Smoker  . Smokeless tobacco: Never Used  Vaping Use  . Vaping Use: Never used  Substance and Sexual Activity  . Alcohol use: Yes    Comment: 2 glasses of wine daily   . Drug use: No  . Sexual activity: Not on file  Other Topics Concern  . Not on file  Social History Narrative   No tobacco    hh of 2   A cat    etoh 2-3 wine  per day    With meals    Married one son and one daughter   Exercise well.    Occupation:  Former Teacher, music Strain:   . Difficulty of Paying Living Expenses: Not on file  Food Insecurity:   . Worried About Programme researcher, broadcasting/film/video in  the Last Year: Not on file  . Ran Out of Food in the Last Year: Not on file  Transportation Needs:   . Lack of Transportation (Medical): Not on file  . Lack of Transportation (Non-Medical): Not on file  Physical Activity:   . Days of Exercise per Week: Not on file  . Minutes of Exercise per Session: Not on file  Stress:   . Feeling of Stress : Not on file  Social Connections:   . Frequency of Communication with Friends and Family: Not on file  . Frequency of Social Gatherings with Friends and Family: Not on file  . Attends Religious Services: Not on file  . Active Member of Clubs or Organizations: Not on file  . Attends Banker Meetings: Not on file  .  Marital Status: Not on file  Intimate Partner Violence:   . Fear of Current or Ex-Partner: Not on file  . Emotionally Abused: Not on file  . Physically Abused: Not on file  . Sexually Abused: Not on file    Review of Systems: 12 system ROS is negative except as noted above with the additions of depression, fatigue, insomnia, and sore throat.   Physical Exam: General:   Alert,  well-nourished, pleasant and cooperative in NAD Head:  Normocephalic and atraumatic. Eyes:  Sclera clear, no icterus.   Conjunctiva pink. Ears:  Normal auditory acuity. Nose:  No deformity, discharge,  or lesions. Mouth:  No deformity or lesions.   Neck:  Supple; no masses or thyromegaly. Lungs:  Clear throughout to auscultation.   No wheezes. Heart:  Regular rate and rhythm; no murmurs. Abdomen:  Soft, nontender, nondistended, normal bowel sounds, no rebound or guarding. No hepatosplenomegaly.   Rectal:  Deferred  Msk:  Symmetrical. No boney deformities LAD: No inguinal or umbilical LAD Extremities:  No clubbing or edema. Neurologic:  Alert and  oriented x4;  grossly nonfocal Skin:  Intact without significant lesions or rashes. Psych:  Alert and cooperative. Normal mood and affect.    Tyshauna Finkbiner L. Orvan Falconer, MD, MPH 07/29/2020, 10:59  AM

## 2020-07-29 NOTE — Patient Instructions (Addendum)
We have two choices:  (1) I am recommending a trial of FDGard - 2 capsules taken twice daily for 4 weeks.  Please take FDGard 30 to 60 minutes before meals with water.  There is no distinct pattern of side effects with FDGard, but, sometimes patients experience a mild tingling sensation in the gut within the first 30 minutes. This sensation should subsequently subside.   (2) Consider a trial of pantoprazole 40 mg taken every morning.   I recommend trying either one for 2 weeks. If no improvement after 2 weeks, please try to other option.    Given your constipation, I recommend adding a daily dose of Citrucel.    Belly breathing can help improve your GI symptoms. Sometimes this is called abdominal breathing or diaphragmatic breathing.      - Sit upright in a chair. Your spine should be straight, knees bent, shoulders loose, and head and neck relaxed.     - You mouth can be slightly open with teeth separated.      - Place on hand on your chest and one hand on your abdomen, just below the rib cage.     - Breathe in for 4 second through your nose and fee your stomach pushing out against your hand. Try to keep the hand on your chest from moving.      - Hold your stomach muscles tight and then start to let your belly deflate as you exhale for 6 seconds. It helps to purse your lips on the exhale like you are blowing through a straw.      - I like this video from the [employer] [place] if you would like more information:     - [email or web address].[email or web address]   Let's check in to see how you are doing in 4-6 weeks, earlier as needed.    I appreciate the  opportunity to care for you  Thank You   Raeanne Barry

## 2020-08-18 ENCOUNTER — Other Ambulatory Visit: Payer: Self-pay

## 2020-08-18 ENCOUNTER — Encounter: Payer: Self-pay | Admitting: Internal Medicine

## 2020-08-18 ENCOUNTER — Telehealth (INDEPENDENT_AMBULATORY_CARE_PROVIDER_SITE_OTHER): Payer: Medicare Other | Admitting: Internal Medicine

## 2020-08-18 VITALS — Ht 64.0 in | Wt 113.0 lb

## 2020-08-18 DIAGNOSIS — R5383 Other fatigue: Secondary | ICD-10-CM | POA: Diagnosis not present

## 2020-08-18 DIAGNOSIS — G478 Other sleep disorders: Secondary | ICD-10-CM

## 2020-08-18 DIAGNOSIS — Z79899 Other long term (current) drug therapy: Secondary | ICD-10-CM | POA: Diagnosis not present

## 2020-08-18 DIAGNOSIS — R790 Abnormal level of blood mineral: Secondary | ICD-10-CM

## 2020-08-18 DIAGNOSIS — K5900 Constipation, unspecified: Secondary | ICD-10-CM

## 2020-08-18 DIAGNOSIS — N398 Other specified disorders of urinary system: Secondary | ICD-10-CM

## 2020-08-18 MED ORDER — BUPROPION HCL ER (XL) 150 MG PO TB24: 150.0000 mg | ORAL_TABLET | Freq: Every day | ORAL | 1 refills | Status: DC

## 2020-08-18 NOTE — Progress Notes (Signed)
Virtual Visit via Video Note  I connected with@ on 08/18/20 at  1:30 PM EDT by a video enabled telemedicine application and verified that I am speaking with the correct person using two identifiers. Location patient: home Location provider:work  office Persons participating in the virtual visit: patient, provider  [place] national recommendations  regarding COVID 19 pandemic   video visit is advised over in office visit for this patient.  Patient aware  of the limitations of evaluation and management by telemedicine and  availability of in person appointments. and agreed to proceed.   HPI: Sheri Jensen presents for video visit On going fatigue not wanting to do anything   Tired   After shopping all day wants to lay down and soon after getting up in am  Sertraline did help energy? But had se of imbalance and falling dizzy    Resolved when stopped  Tolerated Wellbutrin in past  Not sure of positive effect . etoh 1.5- 2  per day   Dr Orvan Falconer  [facility] med to help esophageal  Sx? May take if remembers  3 x    ROS: See pertinent positives and negatives per HPI. Gained  8 # not exercising  gym used to do weight less energy but no cp sob   Feels sometimes has to lay down and rest   Not that refreshed in am    No osa  Hx  Takes  ocass  otc coricidin for allergy ha 3 x per week  Taking  otc  Multivitamin  Constipation  Uses colace and helps , hard to void bladder fully  Has to push  No dysuria    Past Medical History:  Diagnosis Date  . Depression   . Hyperlipidemia   . Hypertension     Past Surgical History:  Procedure Laterality Date  . chidbirth     x2  . COLONOSCOPY  03/00   2/10- normal     Family History  Problem Relation Age of Onset  . Crohn's disease Daughter   . Cancer Maternal Grandfather        colon  . Diabetes Paternal Grandfather   . Deafness Grandchild        congenital    Social History   Tobacco Use  . Smoking status: Never Smoker  . Smokeless tobacco: Never  Used  Vaping Use  . Vaping Use: Never used  Substance Use Topics  . Alcohol use: Yes    Comment: 2 glasses of wine daily   . Drug use: No      Current Outpatient Medications:  .  acetaminophen (TYLENOL) 325 MG tablet, Take 650 mg by mouth every 6 (six) hours as needed., Disp: , Rfl:  .  calcium-vitamin D (OSCAL) 250-125 MG-UNIT per tablet, Take 1 tablet by mouth daily. Reported on 12/25/2015, Disp: , Rfl:  .  Chlorphen-Pseudoephed-APAP (CORICIDIN D PO), Take by mouth as needed., Disp: , Rfl:  .  diphenhydrAMINE (BENADRYL) 25 MG tablet, Take 1 tablet by mouth as needed., Disp: , Rfl:  .  DM-Doxylamine-Acetaminophen (NYQUIL COLD & FLU PO), Take by mouth as needed., Disp: , Rfl:  .  famotidine (PEPCID) 20 MG tablet, Take 20 mg by mouth daily as needed for heartburn or indigestion., Disp: , Rfl:  .  ibuprofen (ADVIL) 100 MG tablet, Take 100 mg by mouth every 6 (six) hours as needed for fever., Disp: , Rfl:  .  Melatonin 5 MG CAPS, Take by mouth at bedtime as needed., Disp: ,  Rfl:  .  Multiple Vitamins-Minerals (WOMENS ONE DAILY PO), Take by mouth daily., Disp: , Rfl:  .  pantoprazole (PROTONIX) 40 MG tablet, Take 1 tablet (40 mg total) by mouth daily. Every morning, Disp: 90 tablet, Rfl: 3 .  Simethicone 125 MG CAPS, Take by mouth as needed., Disp: , Rfl:  .  buPROPion (WELLBUTRIN XL) 150 MG 24 hr tablet, Take 1 tablet (150 mg total) by mouth daily. Increase to 2 po q am after 1 week, Disp: 60 tablet, Rfl: 1  EXAM: BP Readings from Last 3 Encounters:  07/29/20 130/76  04/24/20 (!) 143/73  01/07/20 122/76    VITALS per patient if applicable:  GENERAL: alert, oriented, appears well and in no acute distress  HEENT: atraumatic, conjunttiva clear, no obvious abnormalities on inspection of external nose and ears  NECK: normal movements of the head and neck  LUNGS: on inspection no signs of respiratory distress, breathing rate appears normal, no obvious gross SOB, gasping or  wheezing  CV: no obvious cyanosis  PSYCH/NEURO: pleasant and cooperative, no obvious depression or anxiety, speech and thought processing grossly intact Lab Results  Component Value Date   WBC 4.4 02/19/2020   HGB 12.7 02/19/2020   HCT 37.7 02/19/2020   PLT 254.0 02/19/2020   GLUCOSE 95 02/19/2020   CHOL 238 (H) 02/19/2020   TRIG 118.0 02/19/2020   HDL 88.70 02/19/2020   LDLDIRECT 109.9 06/11/2013   LDLCALC 126 (H) 02/19/2020   ALT 9 02/19/2020   AST 15 02/19/2020   NA 139 02/19/2020   K 4.7 02/19/2020   CL 102 02/19/2020   CREATININE 0.77 02/19/2020   BUN 15 02/19/2020   CO2 31 02/19/2020   TSH 1.57 02/19/2020   HGBA1C 5.4 01/14/2020    ASSESSMENT AND PLAN:  Discussed the following assessment and plan:    ICD-10-CM   1. Low energy  R53.83   2. Medication management  Z79.899   3. Always tired  R53.83   4. Constipation, unspecified constipation type  K59.00   5. Voiding dysfunction  N39.8   6. Abnormal iron saturation  R79.0   7. Unrefreshed by sleep  G47.8     Counseled.  Add wellbutrin again 150 in to 300 and rov or contact  message  'disc poss sleep  Evaluation for un refreshed by sleep .  Suppose could try another  Ssri but concern with se of sertraline    consider referral to neuro sleep to help assess if sleep disorder or other cause of  Sx  To help direct care  Can try  Prenatals that have iron for  Borderline low iron saturation   Avoiding  Constipation  counseled about bowel bladder  synchrony and con management constipation  Colace   And increase fluids ( am )  To see if helps bladder voiding   Expectant management and discussion of plan and treatment with opportunity to ask questions and all were answered. The patient agreed with the plan and demonstrated an understanding of the instructions.   Advised to call back or seek an in-person evaluation if worsening  or having  further concerns . Return for 1 month progress  report   virtual or message depending  on next step.    Berniece Andreas, MD

## 2020-09-30 DIAGNOSIS — Z1231 Encounter for screening mammogram for malignant neoplasm of breast: Secondary | ICD-10-CM | POA: Diagnosis not present

## 2020-10-13 ENCOUNTER — Ambulatory Visit: Payer: Medicare Other | Admitting: Gastroenterology

## 2020-10-16 ENCOUNTER — Telehealth: Payer: Self-pay | Admitting: Internal Medicine

## 2020-10-16 NOTE — Telephone Encounter (Signed)
Left message for patient to call back and schedule Medicare Annual Wellness Visit (AWV) either virtually or in office.   Last AWV 10/12/16  please schedule at anytime with [facility] Nurse Health Advisor 1 or 2   This should be a 45 minute visit.

## 2020-11-24 ENCOUNTER — Ambulatory Visit: Payer: Medicare Other | Admitting: Gastroenterology

## 2020-11-24 ENCOUNTER — Encounter: Payer: Self-pay | Admitting: Gastroenterology

## 2020-11-24 VITALS — BP 134/80 | HR 67 | Ht 64.0 in | Wt 116.4 lb

## 2020-11-24 DIAGNOSIS — R195 Other fecal abnormalities: Secondary | ICD-10-CM

## 2020-11-24 DIAGNOSIS — R142 Eructation: Secondary | ICD-10-CM

## 2020-11-24 NOTE — Patient Instructions (Addendum)
I am so glad that you are feeling better.   If your symptoms return, please try Prilosec (also known as omeprazole) 40 mg taken every morning  for 2 weeks and call me to schedule a follow-up appointment.   Please call me to schedule a colonoscopy for any change in bowel habits - including ongoing mucous in the stool.  If you are age 73 or older, your body mass index should be between 23-30. Your Body mass index is 19.98 kg/m. If this is out of the aforementioned range listed, please consider follow up with your Primary Care Provider.  Thank you for trusting me with your gastrointestinal care!    Tressia Danas, MD, MPH

## 2020-11-24 NOTE — Progress Notes (Signed)
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 Improved with minimizing wine in the diet. Eructation can be associated with reflux and functional dyspepsia, as well as other etiologies. She remains reluctant, although not opposed, to endoscopy for further evaluation. Atypical presentation for symptomatic constipation. Anxiety may be contributing.    At the very end of our visit, she mentioned a single episode of mucous in the stool without other changes in bowel habits. I encouraged her to schedule a colonoscopy as this could be related to malignancy not evaluated by [facility]. She remained resistant and wished to discuss with Dr. Fabian Sharp.   PLAN: - Continue to avoid carbonation, chewing gum, and eating too quickly - Use diaphragmatic breathing PRN - Continue to minimize wine drinking - Trial of omeprazole 40 mg QAM if symptoms recur - EGD if symptoms worsen and are not improving with PPI therapy - Consider baclofen 10 mg TID if symptoms persist - She may call to schedule a colonoscopy at her convenience - Follow-up PRN  Please see the "Patient Instructions" section for addition details about the plan.   HPI: Sheri Jensen is a 75 y.o. female returns in follow-up after her initial consultation for eructations 07/29/20.   Seen today for a several years history of post-prandial eructation, that had become more bothersome over the last 2-3 years. It was occcuring immediately after eating, resolved within minutes and improved when sitting upright.  Some association with anxiety and frequent sighing. She does not chew gum, smoke, drink carbonated beverages, or eat quickly  gulping foods and liquids. Embarrassed by the symptoms, although they did not prevent her from eating out or with other people.    Celiac panel negative 01/2020 Ferritin, iron, and hemoglobin normal 01/2020  Abdominal ultrasound obtained for nausea, intractable vomiting, and eructation in 2017 was normal.   Normal colonoscopy 2000 and  2010. Cologard negative 04/2020  Returns in follow-up reporting a resolution in her symptoms.  Trial of Pepcid provided no significant relief. Headache with FDGard. Unable to find the pantoprazole for a PPI trial.   Stopped drinking a glass of wine prior to dinner. Her husband even notices that she is better. Does better with wine during meal.    Past Medical History:  Diagnosis Date  . Depression   . Hyperlipidemia   . Hypertension     Past Surgical History:  Procedure Laterality Date  . chidbirth     x2  . COLONOSCOPY  03/00   2/10- normal     Current Outpatient Medications  Medication Sig Dispense Refill  . acetaminophen (TYLENOL) 325 MG tablet Take 650 mg by mouth every 6 (six) hours as needed.    . calcium-vitamin D (OSCAL) 250-125 MG-UNIT per tablet Take 1 tablet by mouth daily. Reported on 12/25/2015    . Chlorphen-Pseudoephed-APAP (CORICIDIN D PO) Take by mouth as needed.    . diphenhydrAMINE (BENADRYL) 25 MG tablet Take 1 tablet by mouth as needed.    Marland Kitchen DM-Doxylamine-Acetaminophen (NYQUIL COLD & FLU PO) Take by mouth as needed.    . famotidine (PEPCID) 20 MG tablet Take 20 mg by mouth daily as needed for heartburn or indigestion.    Marland Kitchen  ibuprofen (ADVIL) 100 MG tablet Take 100 mg by mouth every 6 (six) hours as needed for fever.    . Melatonin 5 MG CAPS Take by mouth at bedtime as needed.    . Multiple Vitamins-Minerals (WOMENS ONE DAILY PO) Take by mouth daily.    . Simethicone 125 MG CAPS Take by mouth as needed.     No current facility-administered medications for this visit.    Allergies as of 11/24/2020  . (No Known Allergies)     Family History  Problem Relation Age of Onset  . Crohn's disease Daughter   . Cancer Maternal Grandfather        colon  . Colon cancer Maternal Grandfather   . Diabetes Paternal Grandfather   . Deafness Grandchild        congenital  . Esophageal cancer Neg Hx   . Pancreatic cancer Neg Hx   . Stomach cancer Neg Hx     Social History   Socioeconomic History  . Marital status: Married    Spouse name: Not on file  . Number of children: Not on file  . Years of education: Not on file  . Highest education level: Not on file  Occupational History  . Not on file  Tobacco Use  . Smoking status: Never Smoker  . Smokeless tobacco: Never Used  Vaping Use  . Vaping Use: Never used  Substance and Sexual Activity  . Alcohol use: Yes    Comment: 2 glasses of wine daily   . Drug use: No  . Sexual activity: Not on file  Other Topics Concern  . Not on file  Social History Narrative   No tobacco    hh of 2   A cat    etoh 2-3 wine  per day    With meals    Married one son and one daughter   Exercise well.    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 Eyes:  Sclera clear, no icterus.   Conjunctiva pink. Abdomen:  Soft, nontender, nondistended, normal bowel sounds, no rebound or guarding. No hepatosplenomegaly.   Neurologic:  Alert and  oriented x4;  grossly nonfocal Skin:  Intact without significant lesions or rashes. Psych:  Alert and cooperative. Normal mood and affect.    Joshua Soulier L. Orvan Falconer, MD, MPH 11/24/2020, 11:28 AM

## 2021-01-14 ENCOUNTER — Telehealth: Payer: Self-pay | Admitting: Internal Medicine

## 2021-01-14 NOTE — Telephone Encounter (Signed)
Left message for patient to call back and schedule Medicare Annual Wellness Visit (AWV) either virtually or in office. No detailed message  Last AWV ;10/12/16 please schedule at anytime with [facility] Nurse Health Advisor 1 or 2   This should be a 45 minute visit.

## 2021-03-15 DIAGNOSIS — H5203 Hypermetropia, bilateral: Secondary | ICD-10-CM | POA: Diagnosis not present

## 2021-03-17 ENCOUNTER — Other Ambulatory Visit: Payer: Self-pay

## 2021-03-17 ENCOUNTER — Ambulatory Visit (INDEPENDENT_AMBULATORY_CARE_PROVIDER_SITE_OTHER): Payer: Medicare Other | Admitting: Internal Medicine

## 2021-03-17 ENCOUNTER — Encounter: Payer: Self-pay | Admitting: Internal Medicine

## 2021-03-17 VITALS — BP 120/66 | HR 78 | Temp 97.9°F | Ht 64.0 in | Wt 112.2 lb

## 2021-03-17 DIAGNOSIS — R5383 Other fatigue: Secondary | ICD-10-CM | POA: Diagnosis not present

## 2021-03-17 DIAGNOSIS — M545 Low back pain, unspecified: Secondary | ICD-10-CM | POA: Diagnosis not present

## 2021-03-17 DIAGNOSIS — E785 Hyperlipidemia, unspecified: Secondary | ICD-10-CM

## 2021-03-17 DIAGNOSIS — Z79899 Other long term (current) drug therapy: Secondary | ICD-10-CM

## 2021-03-17 DIAGNOSIS — H9190 Unspecified hearing loss, unspecified ear: Secondary | ICD-10-CM

## 2021-03-17 DIAGNOSIS — Z0000 Encounter for general adult medical examination without abnormal findings: Secondary | ICD-10-CM

## 2021-03-17 DIAGNOSIS — M8580 Other specified disorders of bone density and structure, unspecified site: Secondary | ICD-10-CM | POA: Diagnosis not present

## 2021-03-17 DIAGNOSIS — G8929 Other chronic pain: Secondary | ICD-10-CM

## 2021-03-17 LAB — CBC WITH DIFFERENTIAL/PLATELET
Basophils Absolute: 0 10*3/uL (ref 0.0–0.1)
Basophils Relative: 0.5 % (ref 0.0–3.0)
Eosinophils Absolute: 0.1 10*3/uL (ref 0.0–0.7)
Eosinophils Relative: 2.1 % (ref 0.0–5.0)
HCT: 38.7 % (ref 36.0–46.0)
Hemoglobin: 13 g/dL (ref 12.0–15.0)
Lymphocytes Relative: 25.7 % (ref 12.0–46.0)
Lymphs Abs: 1.5 10*3/uL (ref 0.7–4.0)
MCHC: 33.5 g/dL (ref 30.0–36.0)
MCV: 98.1 fl (ref 78.0–100.0)
Monocytes Absolute: 0.5 10*3/uL (ref 0.1–1.0)
Monocytes Relative: 9.1 % (ref 3.0–12.0)
Neutro Abs: 3.6 10*3/uL (ref 1.4–7.7)
Neutrophils Relative %: 62.6 % (ref 43.0–77.0)
Platelets: 254 10*3/uL (ref 150.0–400.0)
RBC: 3.94 Mil/uL (ref 3.87–5.11)
RDW: 13.9 % (ref 11.5–15.5)
WBC: 5.8 10*3/uL (ref 4.0–10.5)

## 2021-03-17 LAB — HEPATIC FUNCTION PANEL
ALT: 10 U/L (ref 0–35)
AST: 17 U/L (ref 0–37)
Albumin: 4.5 g/dL (ref 3.5–5.2)
Alkaline Phosphatase: 64 U/L (ref 39–117)
Bilirubin, Direct: 0.1 mg/dL (ref 0.0–0.3)
Total Bilirubin: 0.5 mg/dL (ref 0.2–1.2)
Total Protein: 7 g/dL (ref 6.0–8.3)

## 2021-03-17 LAB — BASIC METABOLIC PANEL WITH GFR
BUN: 26 mg/dL — ABNORMAL HIGH (ref 6–23)
CO2: 31 meq/L (ref 19–32)
Calcium: 10.1 mg/dL (ref 8.4–10.5)
Chloride: 102 meq/L (ref 96–112)
Creatinine, Ser: 0.84 mg/dL (ref 0.40–1.20)
GFR: 69.05 mL/min
Glucose, Bld: 107 mg/dL — ABNORMAL HIGH (ref 70–99)
Potassium: 4.3 meq/L (ref 3.5–5.1)
Sodium: 140 meq/L (ref 135–145)

## 2021-03-17 LAB — LIPID PANEL
Cholesterol: 264 mg/dL — ABNORMAL HIGH (ref 0–200)
HDL: 77.5 mg/dL (ref >39.00)
LDL Cholesterol: 160 mg/dL — ABNORMAL HIGH (ref 0–99)
NonHDL: 186.28
Total CHOL/HDL Ratio: 3
Triglycerides: 130 mg/dL (ref 0.0–149.0)
VLDL: 26 mg/dL (ref 0.0–40.0)

## 2021-03-17 LAB — C-REACTIVE PROTEIN: CRP: <1 mg/dL (ref 0.5–20.0)

## 2021-03-17 LAB — TSH: TSH: 1.22 u[IU]/mL (ref 0.35–4.50)

## 2021-03-17 LAB — SEDIMENTATION RATE: Sed Rate: 11 mm/hr (ref 0–30)

## 2021-03-17 LAB — T4, FREE: Free T4: 0.68 ng/dL (ref 0.60–1.60)

## 2021-03-17 NOTE — Progress Notes (Signed)
Chief Complaint  Patient presents with  . Annual Exam    CPE/labs.  Not fasting today.   C/o having some hearing loss, fatigue and achy back.      HPI: Sheri MalkinKarla E Jensen 75 y.o. comes in today for Preventive Medicare exam/ wellness visit .Since last visit. Doing ok  Hearing   Continuing to decline some   n Mood better  Helping take care of grand children Still tired a lot  Feels like afternoon nap To travel soon on [place] No recent.    BP    But has been ok  Sleep  Not as good when has evening  etoh  But  Tends to have 1-2 per night.   Is aware Diet  Eating Oatmeal eating and bowels doing better  Back pain  Stiffness   In am .    For a while  Non radiating  No fever  Referral.   Continues  But problem  May need a new matress no radiation of systemic sx   Health Maintenance  Topic Date Due  . COLONOSCOPY (Pts [id number] Insurance coverage will need to be confirmed)  12/30/2018  . TETANUS/TDAP  08/10/2020  . INFLUENZA VACCINE  06/14/2021  . MAMMOGRAM  09/30/2022  . DEXA SCAN  Completed  . COVID-19 Vaccine  Completed  . Hepatitis C Screening  Completed  . PNA vac Low Risk Adult  Completed  . HPV VACCINES  Aged Out   Health Maintenance Review LIFESTYLE:  Exercise:  No reg in pandemic  Recent  Back  Work out aerobics    Tobacco/ETS:n Alcohol: 2 per day  Sugar beverages:  rare Sleep: interrupt  Of etoh.  Drug use: no HH:  2  [facility]     Hearing:  Biomedical scientistHearing  Granddaughter .     Vision:  No limitations at present . Last eye check UTD  Safety:  Has smoke detector and wears seat belts.  . No excess sun exposure. Sees dentist regularly.  Falls: n  Depression: No anhedonia unusual crying or depressive symptoms  Nutrition: Eats well balanced diet; adequate calcium and vitamin D. No swallowing chewing problems.  Injury: no major injuries in the last six months.  Other healthcare providers:  Reviewed today .  Preventive parameters: up-to-date  Reviewed   ADLS:    There are no problems or need for assistance  driving, feeding, obtaining food, dressing, toileting and bathing, managing money using phone. She is independent.  ROS:  GEN/ HEENT: No fever, significant weight changes sweats headaches vision problems hearing changes, CV/ PULM; No chest pain shortness of breath cough, syncope,edema  change in exercise tolerance. GI /GU: No adominal pain, vomiting, change in bowel habits. No blood in the stool. No significant GU symptoms. SKIN/HEME: ,no acute skin rashes suspicious lesions or bleeding. No lymphadenopathy, nodules, masses.  NEURO/ PSYCH:  No neurologic signs such as weakness numbness. No depression anxiety. IMM/ Allergy: No unusual infections.  Allergy .   REST of 12 system review negative except as per HPI   Past Medical History:  Diagnosis Date  . Depression   . Hyperlipidemia   . Hypertension     Family History  Problem Relation Age of Onset  . Crohn's disease Daughter   . Cancer Maternal Grandfather        colon  . Colon cancer Maternal Grandfather   . Diabetes Paternal Grandfather   . Deafness Grandchild        congenital  . Esophageal cancer Neg  Hx   . Pancreatic cancer Neg Hx   . Stomach cancer Neg Hx     Social History   Socioeconomic History  . Marital status: Married    Spouse name: Not on file  . Number of children: Not on file  . Years of education: Not on file  . Highest education level: Not on file  Occupational History  . Not on file  Tobacco Use  . Smoking status: Never Smoker  . Smokeless tobacco: Never Used  Vaping Use  . Vaping Use: Never used  Substance and Sexual Activity  . Alcohol use: Yes    Comment: 2 glasses of wine daily   . Drug use: No  . Sexual activity: Not on file  Other Topics Concern  . Not on file  Social History Narrative   No tobacco    hh of 2   A cat    etoh 2-3 wine  per day    With meals    Married one son and one daughter   Exercise well.    Occupation:  Former  Teacher, music Strain: Not on [employer] Insecurity: Not on file  Transportation Needs: Not on file  Physical Activity: Not on file  Stress: Not on file  Social Connections: Not on file    Outpatient Encounter Medications as of 03/17/2021  Medication Sig  . acetaminophen (TYLENOL) 325 MG tablet Take 650 mg by mouth every 6 (six) hours as needed.  . calcium-vitamin D (OSCAL) 250-125 MG-UNIT per tablet Take 1 tablet by mouth daily. Reported on 12/25/2015  . Chlorphen-Pseudoephed-APAP (CORICIDIN D PO) Take by mouth as needed.  . diphenhydrAMINE (BENADRYL) 25 MG tablet Take 1 tablet by mouth as needed.  Marland Kitchen DM-Doxylamine-Acetaminophen (NYQUIL COLD & FLU PO) Take by mouth as needed.  . famotidine (PEPCID) 20 MG tablet Take 20 mg by mouth daily as needed for heartburn or indigestion.  Marland Kitchen ibuprofen (ADVIL) 100 MG tablet Take 100 mg by mouth every 6 (six) hours as needed for fever.  . Melatonin 5 MG CAPS Take by mouth at bedtime as needed.  . Multiple Vitamins-Minerals (WOMENS ONE DAILY PO) Take by mouth daily.  . Simethicone 125 MG CAPS Take by mouth as needed.   No facility-administered encounter medications on file as of 03/17/2021.    EXAM:  BP 120/66   Pulse 78   Temp 97.9 F (36.6 C) (Oral)   Ht 5\' 4"  (1.626 m)   Wt 112 lb 3.2 oz (50.9 kg)   SpO2 98%   BMI 19.26 kg/m   Body mass index is 19.26 kg/m.  Physical Exam: Vital signs reviewed is a well-developed well-nourished alert cooperative   who appears stated age in no acute distress.  HEENT: normocephalic atraumatic , Eyes: PERRL EOM's full, conjunctiva clear, Nares: paten,t no deformity discharge or tenderness., Ears: no deformity EAC's clear TMs with normal landmarks. Mouth masked NECK: supple without masses, thyromegaly or bruits. CHEST/PULM:  Clear to auscultation and percussion breath sounds equal no wheeze , rales or rhonchi. No chest wall deformities or  tenderness. Breast: normal by inspection . No dimpling, discharge, masses, tenderness or discharge . CV: PMI is nondisplaced, S1 S2 no gallops, murmurs, rubs. Peripheral pulses are full without delay.No JVD .  ABDOMEN: Bowel sounds normal nontender  No guard or rebound, no hepato splenomegal no CVA tenderness.   Extremtities:  No clubbing cyanosis or edema, no acute joint swelling or redness  no focal atrophy NEURO:  Oriented x3, cranial nerves 3-12 appear to be intact, no obvious focal weakness,gait within normal limits no abnormal reflexes or asymmetrical SKIN: No acute rashes normal turgor, color, no bruising or petechiae. PSYCH: Oriented, good eye contact, no obvious depression anxiety, cognition and judgment appear normal. LN: no cervical axillary inguinal adenopathy No noted deficits in memory, attention, and speech.   Lab Results  Component Value Date   WBC 5.8 03/17/2021   HGB 13.0 03/17/2021   HCT 38.7 03/17/2021   PLT 254.0 03/17/2021   GLUCOSE 107 (H) 03/17/2021   CHOL 264 (H) 03/17/2021   TRIG 130.0 03/17/2021   HDL 77.50 03/17/2021   LDLDIRECT 109.9 06/11/2013   LDLCALC 160 (H) 03/17/2021   ALT 10 03/17/2021   AST 17 03/17/2021   NA 140 03/17/2021   K 4.3 03/17/2021   CL 102 03/17/2021   CREATININE 0.84 03/17/2021   BUN 26 (H) 03/17/2021   CO2 31 03/17/2021   TSH 1.22 03/17/2021   HGBA1C 5.4 01/14/2020  The 10-year ASCVD risk score Denman George DC Jr., et al., 2013) is: 11.9%   Values used to calculate the score:     Age: 75 years     Sex: Female     Is Non-Hispanic African American: No     Diabetic: No     Tobacco smoker: No     Systolic Blood Pressure: 120 mmHg     Is BP treated: No     HDL Cholesterol: 77.5 mg/dL     Total Cholesterol: 264 mg/dL  Lab Results  Component Value Date   VITAMINB12 688 01/14/2020    ASSESSMENT AND PLAN:  Discussed the following assessment and plan:  Visit for preventive health examination  Always tired - Plan: Basic  metabolic panel, CBC with Differential/Platelet, Hepatic function panel, Lipid panel, TSH, T4, free, Sedimentation rate, C-reactive protein, C-reactive protein, Sedimentation rate, T4, free, TSH, Lipid panel, Hepatic function panel, CBC with Differential/Platelet, Basic metabolic panel  Medication management - Plan: Basic metabolic panel, CBC with Differential/Platelet, Hepatic function panel, Lipid panel, TSH, T4, free, Sedimentation rate, C-reactive protein, C-reactive protein, Sedimentation rate, T4, free, TSH, Lipid panel, Hepatic function panel, CBC with Differential/Platelet, Basic metabolic panel  Chronic midline low back pain without sciatica - Plan: Basic metabolic panel, CBC with Differential/Platelet, Hepatic function panel, Lipid panel, TSH, T4, free, Sedimentation rate, C-reactive protein, C-reactive protein, Sedimentation rate, T4, free, TSH, Lipid panel, Hepatic function panel, CBC with Differential/Platelet, Basic metabolic panel  Hyperlipidemia, unspecified hyperlipidemia type - Plan: Basic metabolic panel, CBC with Differential/Platelet, Hepatic function panel, Lipid panel, TSH, T4, free, Sedimentation rate, C-reactive protein, C-reactive protein, Sedimentation rate, T4, free, TSH, Lipid panel, Hepatic function panel, CBC with Differential/Platelet, Basic metabolic panel  Decreased hearing, unspecified laterality - Plan: Basic metabolic panel, CBC with Differential/Platelet, Hepatic function panel, Lipid panel, TSH, T4, free, Sedimentation rate, C-reactive protein, C-reactive protein, Sedimentation rate, T4, free, TSH, Lipid panel, Hepatic function panel, CBC with Differential/Platelet, Basic metabolic panel, Ambulatory referral to Audiology  Osteopenia, unspecified location - Plan: Basic metabolic panel, CBC with Differential/Platelet, Hepatic function panel, Lipid panel, TSH, T4, free, Sedimentation rate, C-reactive protein, C-reactive protein, Sedimentation rate, T4, free, TSH, Lipid  panel, Hepatic function panel, CBC with Differential/Platelet, Basic metabolic panel Updating  Monitoring labs  Think her back pain is mechanical    Chief Complaint  Patient presents with  . Annual Exam    CPE/labs.  Not fasting today.   C/o having some hearing loss, fatigue  and achy back.      HPI: Patient  Sheri Jensen  75 y.o. comes in today for Preventive Health Care visit   Health Maintenance  Topic Date Due  . COLONOSCOPY (Pts 45-26yrs Insurance coverage will need to be confirmed)  12/30/2018  . TETANUS/TDAP  08/10/2020  . INFLUENZA VACCINE  06/14/2021  . MAMMOGRAM  09/30/2022  . DEXA SCAN  Completed  . COVID-19 Vaccine  Completed  . Hepatitis C Screening  Completed  . PNA vac Low Risk Adult  Completed  . HPV VACCINES  Aged Out   Health Maintenance Review LIFESTYLE:  Exercise:   Tobacco/ETS: Alcohol:  Sugar beverages: Sleep: Drug use: no HH of  Work:    ROS:  GEN/ HEENT: No fever, significant weight changes sweats headaches vision problems hearing changes, CV/ PULM; No chest pain shortness of breath cough, syncope,edema  change in exercise tolerance. GI /GU: No adominal pain, vomiting, change in bowel habits. No blood in the stool. No significant GU symptoms. SKIN/HEME: ,no acute skin rashes suspicious lesions or bleeding. No lymphadenopathy, nodules, masses.  NEURO/ PSYCH:  No neurologic signs such as weakness numbness. No depression anxiety. IMM/ Allergy: No unusual infections.  Allergy .   REST of 12 system review negative except as per HPI   Past Medical History:  Diagnosis Date  . Depression   . Hyperlipidemia   . Hypertension     Past Surgical History:  Procedure Laterality Date  . chidbirth     x2  . COLONOSCOPY  03/00   2/10- normal     Family History  Problem Relation Age of Onset  . Crohn's disease Daughter   . Cancer Maternal Grandfather        colon  . Colon cancer Maternal Grandfather   . Diabetes Paternal Grandfather   .  Deafness Grandchild        congenital  . Esophageal cancer Neg Hx   . Pancreatic cancer Neg Hx   . Stomach cancer Neg Hx     Social History   Socioeconomic History  . Marital status: Married    Spouse name: Not on file  . Number of children: Not on file  . Years of education: Not on file  . Highest education level: Not on file  Occupational History  . Not on file  Tobacco Use  . Smoking status: Never Smoker  . Smokeless tobacco: Never Used  Vaping Use  . Vaping Use: Never used  Substance and Sexual Activity  . Alcohol use: Yes    Comment: 2 glasses of wine daily   . Drug use: No  . Sexual activity: Not on file  Other Topics Concern  . Not on file  Social History Narrative   No tobacco    hh of 2   A cat    etoh 2-3 wine  per day    With meals    Married one son and one daughter   Exercise well.    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 acetaminophen (TYLENOL) 325 MG tablet Take 650 mg by mouth every 6 (six) hours as needed.    . calcium-vitamin D (OSCAL) 250-125 MG-UNIT per  tablet Take 1 tablet by mouth daily. Reported on 12/25/2015    . Chlorphen-Pseudoephed-APAP (CORICIDIN D PO) Take by mouth as needed.    . diphenhydrAMINE (BENADRYL) 25 MG tablet Take 1 tablet by mouth as needed.    Marland Kitchen DM-Doxylamine-Acetaminophen (NYQUIL COLD & FLU PO) Take by mouth as needed.    . famotidine (PEPCID) 20 MG tablet Take 20 mg by mouth daily as needed for heartburn or indigestion.    Marland Kitchen ibuprofen (ADVIL) 100 MG tablet Take 100 mg by mouth every 6 (six) hours as needed for fever.    . Melatonin 5 MG CAPS Take by mouth at bedtime as needed.    . Multiple Vitamins-Minerals (WOMENS ONE DAILY PO)  Take by mouth daily.    . Simethicone 125 MG CAPS Take by mouth as needed.     No facility-administered medications prior to visit.     EXAM:  BP 120/66   Pulse 78   Temp 97.9 F (36.6 C) (Oral)   Ht 5\' 4"  (1.626 m)   Wt 112 lb 3.2 oz (50.9 kg)   SpO2 98%   BMI 19.26 kg/m   Body mass index is 19.26 kg/m. Wt Readings from Last 3 Encounters:  03/17/21 112 lb 3.2 oz (50.9 kg)  11/24/20 116 lb 6 oz (52.8 kg)  08/18/20 113 lb (51.3 kg)    Physical Exam: Vital signs reviewed [id number] is a well-developed well-nourished alert cooperative    who appearsr stated age in no acute distress.  HEENT: normocephalic atraumatic , Eyes: PERRL EOM's full, conjunctiva clear, Nares: paten,t no deformity discharge or tenderness., Ears: no deformity EAC's clear TMs with normal landmarks. Mouth: clear OP, no lesions, edema.  Moist mucous membranes. Dentition in adequate repair. NECK: supple without masses, thyromegaly or bruits. CHEST/PULM:  Clear to auscultation and percussion breath sounds equal no wheeze , rales or rhonchi. No chest wall deformities or tenderness. Breast: normal by inspection . No dimpling, discharge, masses, tenderness or discharge . CV: PMI is nondisplaced, S1 S2 no gallops, murmurs, rubs. Peripheral pulses are full without delay.No JVD .  ABDOMEN: Bowel sounds normal nontender  No guard or rebound, no hepato splenomegal no CVA tenderness.  No hernia. Extremtities:  No clubbing cyanosis or edema, no acute joint swelling or redness no focal atrophy NEURO:  Oriented x3, cranial nerves 3-12 appear to be intact, no obvious focal weakness,gait within normal limits no abnormal reflexes or asymmetrical SKIN: No acute rashes normal turgor, color, no bruising or petechiae. PSYCH: Oriented, good eye contact, no obvious depression anxiety, cognition and judgment appear normal. LN: no cervical axillary inguinal adenopathy  Lab Results  Component Value Date   WBC 5.8 03/17/2021   HGB  13.0 03/17/2021   HCT 38.7 03/17/2021   PLT 254.0 03/17/2021   GLUCOSE 107 (H) 03/17/2021   CHOL 264 (H) 03/17/2021   TRIG 130.0 03/17/2021   HDL 77.50 03/17/2021   LDLDIRECT 109.9 06/11/2013   LDLCALC 160 (H) 03/17/2021   ALT 10 03/17/2021   AST 17 03/17/2021   NA 140 03/17/2021   K 4.3 03/17/2021   CL 102 03/17/2021   CREATININE 0.84 03/17/2021   BUN 26 (H) 03/17/2021   CO2 31 03/17/2021   TSH 1.22 03/17/2021   HGBA1C 5.4 01/14/2020    BP Readings from Last 3 Encounters:  03/17/21 120/66  11/24/20 134/80  07/29/20 130/76    Lab results reviewed with patient   ASSESSMENT AND PLAN:  Discussed the following assessment and plan:  ICD-10-CM   1. Visit for preventive health examination  Z00.00   2. Always tired  R53.83 Basic metabolic panel    CBC with Differential/Platelet    Hepatic function panel    Lipid panel    TSH    T4, free    Sedimentation rate    C-reactive protein    C-reactive protein    Sedimentation rate    T4, free    TSH    Lipid panel    Hepatic function panel    CBC with Differential/Platelet    Basic metabolic panel  3. Medication management  Z79.899 Basic metabolic panel    CBC with Differential/Platelet    Hepatic function panel    Lipid panel    TSH    T4, free    Sedimentation rate    C-reactive protein    C-reactive protein    Sedimentation rate    T4, free    TSH    Lipid panel    Hepatic function panel    CBC with Differential/Platelet    Basic metabolic panel  4. Chronic midline low back pain without sciatica  M54.50 Basic metabolic panel   Z61.09 CBC with Differential/Platelet    Hepatic function panel    Lipid panel    TSH    T4, free    Sedimentation rate    C-reactive protein    C-reactive protein    Sedimentation rate    T4, free    TSH    Lipid panel    Hepatic function panel    CBC with Differential/Platelet    Basic metabolic panel  5. Hyperlipidemia, unspecified hyperlipidemia type  E78.5 Basic  metabolic panel    CBC with Differential/Platelet    Hepatic function panel    Lipid panel    TSH    T4, free    Sedimentation rate    C-reactive protein    C-reactive protein    Sedimentation rate    T4, free    TSH    Lipid panel    Hepatic function panel    CBC with Differential/Platelet    Basic metabolic panel  6. Decreased hearing, unspecified laterality  H91.90 Basic metabolic panel    CBC with Differential/Platelet    Hepatic function panel    Lipid panel    TSH    T4, free    Sedimentation rate    C-reactive protein    C-reactive protein    Sedimentation rate    T4, free    TSH    Lipid panel    Hepatic function panel    CBC with Differential/Platelet    Basic metabolic panel    Ambulatory referral to Audiology  7. Osteopenia, unspecified location  M85.80 Basic metabolic panel    CBC with Differential/Platelet    Hepatic function panel    Lipid panel    TSH    T4, free    Sedimentation rate    C-reactive protein    C-reactive protein    Sedimentation rate    T4, free    TSH    Lipid panel    Hepatic function panel    CBC with Differential/Platelet    Basic metabolic panel   Return for depending on results and how back doing  and yearly .  Patient Care Team: Madelin Headings, MD as PCP - General Patient Instructions   Continue adequate exercise activity. Some of your fatigue may be from interrupted sleep that is aggravated by  the evening alcohol. You can always do a trial of 1 to 2 weeks of no alcohol and watch her sleep and assess your fatigue.  Will arrange audiology referral as we discussed. Blood work today to check metabolic and lipid profile. Blood pressure goal is 130/80 average or below but certainly below 140/90.  If back problem is persistent and progressive we can do further evaluation x-ray to rule out surprises consider other interventions.  Can get the next COVID booster 2 weeks before your trip.   Health Maintenance,  Female Adopting a healthy lifestyle and getting preventive care are important in promoting health and wellness. Ask your health care provider about:  The right schedule for you to have regular tests and exams.  Things you can do on your own to prevent diseases and keep yourself healthy. What should I know about diet, weight, and exercise? Eat a healthy diet  Eat a diet that includes plenty of vegetables, fruits, low-fat dairy products, and lean protein.  Do not eat a lot of foods that are high in solid fats, added sugars, or sodium.   Maintain a healthy weight Body mass index (BMI) is used to identify weight problems. It estimates body fat based on height and weight. Your health care provider can help determine your BMI and help you achieve or maintain a healthy weight. Get regular exercise Get regular exercise. This is one of the most important things you can do for your health. Most adults should:  Exercise for at least 150 minutes each week. The exercise should increase your heart rate and make you sweat (moderate-intensity exercise).  Do strengthening exercises at least twice a week. This is in addition to the moderate-intensity exercise.  Spend less time sitting. Even light physical activity can be beneficial. Watch cholesterol and blood lipids Have your blood tested for lipids and cholesterol at 75 years of age, then have this test every 5 years. Have your cholesterol levels checked more often if:  Your lipid or cholesterol levels are high.  You are older than 73 years of age.  You are at high risk for heart disease. What should I know about cancer screening? Depending on your health history and family history, you may need to have cancer screening at various ages. This may include screening for:  Breast cancer.  Cervical cancer.  Colorectal cancer.  Skin cancer.  Lung cancer. What should I know about heart disease, diabetes, and high blood pressure? Blood pressure  and heart disease  High blood pressure causes heart disease and increases the risk of stroke. This is more likely to develop in people who have high blood pressure readings, are of African descent, or are overweight.  Have your blood pressure checked: ? Every 3-5 years if you are 63-52 years of age. ? Every year if you are 61 years old or older. Diabetes Have regular diabetes screenings. This checks your fasting blood sugar level. Have the screening done:  Once every three years after age 24 if you are at a normal weight and have a low risk for diabetes.  More often and at a younger age if you are overweight or have a high risk for diabetes. What should I know about preventing infection? Hepatitis B If you have a higher risk for hepatitis B, you should be screened for this virus. Talk with your health care provider to find out if you are at risk for hepatitis B infection. Hepatitis C Testing is recommended for:  Everyone born from [birth year]  through 1963.  Anyone with known risk factors for hepatitis C. Sexually transmitted infections (STIs)  Get screened for STIs, including gonorrhea and chlamydia, if: ? You are sexually active and are younger than 73 years of age. ? You are older than 73 years of age and your health care provider tells you that you are at risk for this type of infection. ? Your sexual activity has changed since you were last screened, and you are at increased risk for chlamydia or gonorrhea. Ask your health care provider if you are at risk.  Ask your health care provider about whether you are at high risk for HIV. Your health care provider may recommend a prescription medicine to help prevent HIV infection. If you choose to take medicine to prevent HIV, you should first get tested for HIV. You should then be tested every 3 months for as long as you are taking the medicine. Pregnancy  If you are about to stop having your period (premenopausal) and you may become pregnant,  seek counseling before you get pregnant.  Take 400 to 800 micrograms (mcg) of folic acid every day if you become pregnant.  Ask for birth control (contraception) if you want to prevent pregnancy. Osteoporosis and menopause Osteoporosis is a disease in which the bones lose minerals and strength with aging. This can result in bone fractures. If you are 65 years old or older, or if you are at risk for osteoporosis and fractures, ask your health care provider if you should:  Be screened for bone loss.  Take a calcium or vitamin D supplement to lower your risk of fractures.  Be given hormone replacement therapy (HRT) to treat symptoms of menopause. Follow these instructions at home: Lifestyle  Do not use any products that contain nicotine or tobacco, such as cigarettes, e-cigarettes, and chewing tobacco. If you need help quitting, ask your health care provider.  Do not use street drugs.  Do not share needles.  Ask your health care provider for help if you need support or information about quitting drugs. Alcohol use  Do not drink alcohol if: ? Your health care provider tells you not to drink. ? You are pregnant, may be pregnant, or are planning to become pregnant.  If you drink alcohol: ? Limit how much you use to 0-1 drink a day. ? Limit intake if you are breastfeeding.  Be aware of how much alcohol is in your drink. In the [place] one drink equals one 12 oz bottle of beer (355 mL), one 5 oz glass of wine (148 mL), or one 1 oz glass of hard liquor (44 mL). General instructions  Schedule regular health, dental, and eye exams.  Stay current with your vaccines.  Tell your health care provider if: ? You often feel depressed. ? You have ever been abused or do not feel safe at home. Summary  Adopting a healthy lifestyle and getting preventive care are important in promoting health and wellness.  Follow your health care provider's instructions about healthy diet, exercising, and  getting tested or screened for diseases.  Follow your health care provider's instructions on monitoring your cholesterol and blood pressure. This information is not intended to replace advice given to you by your health care provider. Make sure you discuss any questions you have with your health care provider. Document Revised: 10/24/2018 Document Reviewed: 10/24/2018 Elsevier Patient Education  2021 [employer].    [place]. Elijha Dedman M.D.  and not  Alarming except  [place] .  Will fu if  persistent or progressive   Pt is aware   How etoh effects sleep.  Refer for  Audiology   Fu depending on results and how doing  Patient Care Team: Jovaun Levene, [name], MD as PCP - General  Patient Instructions   Continue adequate exercise activity. Some of your fatigue may be from interrupted sleep that is aggravated by the evening alcohol. You can always do a trial of 1 to 2 weeks of no alcohol and watch her sleep and assess your fatigue.  Will arrange audiology referral as we discussed. Blood work today to check metabolic and lipid profile. Blood pressure goal is 130/80 average or below but certainly below 140/90.  If back problem is persistent and progressive we can do further evaluation x-ray to rule out surprises consider other interventions.  Can get the next COVID booster 2 weeks before your trip.   Health Maintenance, Female Adopting a healthy lifestyle and getting preventive care are important in promoting health and wellness. Ask your health care provider about:  The right schedule for you to have regular tests and exams.  Things you can do on your own to prevent diseases and keep yourself healthy. What should I know about diet, weight, and exercise? Eat a healthy diet  Eat a diet that includes plenty of vegetables, fruits, low-fat dairy products, and lean protein.  Do not eat a lot of foods that are high in solid fats, added sugars, or sodium.   Maintain a healthy weight Body mass  index (BMI) is used to identify weight problems. It estimates body fat based on height and weight. Your health care provider can help determine your BMI and help you achieve or maintain a healthy weight. Get regular exercise Get regular exercise. This is one of the most important things you can do for your health. Most adults should:  Exercise for at least 150 minutes each week. The exercise should increase your heart rate and make you sweat (moderate-intensity exercise).  Do strengthening exercises at least twice a week. This is in addition to the moderate-intensity exercise.  Spend less time sitting. Even light physical activity can be beneficial. Watch cholesterol and blood lipids Have your blood tested for lipids and cholesterol at 73 years of age, then have this test every 5 years. Have your cholesterol levels checked more often if:  Your lipid or cholesterol levels are high.  You are older than 73 years of age.  You are at high risk for heart disease. What should I know about cancer screening? Depending on your health history and family history, you may need to have cancer screening at various ages. This may include screening for:  Breast cancer.  Cervical cancer.  Colorectal cancer.  Skin cancer.  Lung cancer. What should I know about heart disease, diabetes, and high blood pressure? Blood pressure and heart disease  High blood pressure causes heart disease and increases the risk of stroke. This is more likely to develop in people who have high blood pressure readings, are of African descent, or are overweight.  Have your blood pressure checked: ? Every 3-5 years if you are 3-22 years of age. ? Every year if you are 26 years old or older. Diabetes Have regular diabetes screenings. This checks your fasting blood sugar level. Have the screening done:  Once every three years after age 73 if you are at a normal weight and have a low risk for diabetes.  More often and at  a younger  age if you are overweight or have a high risk for diabetes. What should I know about preventing infection? Hepatitis B If you have a higher risk for hepatitis B, you should be screened for this virus. Talk with your health care provider to find out if you are at risk for hepatitis B infection. Hepatitis C Testing is recommended for:  Everyone born from [birth year] through 1963.  Anyone with known risk factors for hepatitis C. Sexually transmitted infections (STIs)  Get screened for STIs, including gonorrhea and chlamydia, if: ? You are sexually active and are younger than 73 years of age. ? You are older than 73 years of age and your health care provider tells you that you are at risk for this type of infection. ? Your sexual activity has changed since you were last screened, and you are at increased risk for chlamydia or gonorrhea. Ask your health care provider if you are at risk.  Ask your health care provider about whether you are at high risk for HIV. Your health care provider may recommend a prescription medicine to help prevent HIV infection. If you choose to take medicine to prevent HIV, you should first get tested for HIV. You should then be tested every 3 months for as long as you are taking the medicine. Pregnancy  If you are about to stop having your period (premenopausal) and you may become pregnant, seek counseling before you get pregnant.  Take 400 to 800 micrograms (mcg) of folic acid every day if you become pregnant.  Ask for birth control (contraception) if you want to prevent pregnancy. Osteoporosis and menopause Osteoporosis is a disease in which the bones lose minerals and strength with aging. This can result in bone fractures. If you are 14 years old or older, or if you are at risk for osteoporosis and fractures, ask your health care provider if you should:  Be screened for bone loss.  Take a calcium or vitamin D supplement to lower your risk of fractures.  Be  given hormone replacement therapy (HRT) to treat symptoms of menopause. Follow these instructions at home: Lifestyle  Do not use any products that contain nicotine or tobacco, such as cigarettes, e-cigarettes, and chewing tobacco. If you need help quitting, ask your health care provider.  Do not use street drugs.  Do not share needles.  Ask your health care provider for help if you need support or information about quitting drugs. Alcohol use  Do not drink alcohol if: ? Your health care provider tells you not to drink. ? You are pregnant, may be pregnant, or are planning to become pregnant.  If you drink alcohol: ? Limit how much you use to 0-1 drink a day. ? Limit intake if you are breastfeeding.  Be aware of how much alcohol is in your drink. In the [place] one drink equals one 12 oz bottle of beer (355 mL), one 5 oz glass of wine (148 mL), or one 1 oz glass of hard liquor (44 mL). General instructions  Schedule regular health, dental, and eye exams.  Stay current with your vaccines.  Tell your health care provider if: ? You often feel depressed. ? You have ever been abused or do not feel safe at home. Summary  Adopting a healthy lifestyle and getting preventive care are important in promoting health and wellness.  Follow your health care provider's instructions about healthy diet, exercising, and getting tested or screened for diseases.  Follow your health care provider's instructions on monitoring  your cholesterol and blood pressure. This information is not intended to replace advice given to you by your health care provider. Make sure you discuss any questions you have with your health care provider. Document Revised: 10/24/2018 Document Reviewed: 10/24/2018 Elsevier Patient Education  2021 [employer].    [place]. Marlisha Vanwyk M.D.

## 2021-03-17 NOTE — Patient Instructions (Addendum)
Continue adequate exercise activity. Some of your fatigue may be from interrupted sleep that is aggravated by the evening alcohol. You can always do a trial of 1 to 2 weeks of no alcohol and watch her sleep and assess your fatigue.  Will arrange audiology referral as we discussed. Blood work today to check metabolic and lipid profile. Blood pressure goal is 130/80 average or below but certainly below 140/90.  If back problem is persistent and progressive we can do further evaluation x-ray to rule out surprises consider other interventions.  Can get the next COVID booster 2 weeks before your trip.   Health Maintenance, Female Adopting a healthy lifestyle and getting preventive care are important in promoting health and wellness. Ask your health care provider about:  The right schedule for you to have regular tests and exams.  Things you can do on your own to prevent diseases and keep yourself healthy. What should I know about diet, weight, and exercise? Eat a healthy diet  Eat a diet that includes plenty of vegetables, fruits, low-fat dairy products, and lean protein.  Do not eat a lot of foods that are high in solid fats, added sugars, or sodium.   Maintain a healthy weight Body mass index (BMI) is used to identify weight problems. It estimates body fat based on height and weight. Your health care provider can help determine your BMI and help you achieve or maintain a healthy weight. Get regular exercise Get regular exercise. This is one of the most important things you can do for your health. Most adults should:  Exercise for at least 150 minutes each week. The exercise should increase your heart rate and make you sweat (moderate-intensity exercise).  Do strengthening exercises at least twice a week. This is in addition to the moderate-intensity exercise.  Spend less time sitting. Even light physical activity can be beneficial. Watch cholesterol and blood lipids Have your blood  tested for lipids and cholesterol at 73 years of age, then have this test every 5 years. Have your cholesterol levels checked more often if:  Your lipid or cholesterol levels are high.  You are older than 73 years of age.  You are at high risk for heart disease. What should I know about cancer screening? Depending on your health history and family history, you may need to have cancer screening at various ages. This may include screening for:  Breast cancer.  Cervical cancer.  Colorectal cancer.  Skin cancer.  Lung cancer. What should I know about heart disease, diabetes, and high blood pressure? Blood pressure and heart disease  High blood pressure causes heart disease and increases the risk of stroke. This is more likely to develop in people who have high blood pressure readings, are of African descent, or are overweight.  Have your blood pressure checked: ? Every 3-5 years if you are 53-24 years of age. ? Every year if you are 12 years old or older. Diabetes Have regular diabetes screenings. This checks your fasting blood sugar level. Have the screening done:  Once every three years after age 60 if you are at a normal weight and have a low risk for diabetes.  More often and at a younger age if you are overweight or have a high risk for diabetes. What should I know about preventing infection? Hepatitis B If you have a higher risk for hepatitis B, you should be screened for this virus. Talk with your health care provider to find out if you are at  risk for hepatitis B infection. Hepatitis C Testing is recommended for:  Everyone born from [birth year] through 1965.  Anyone with known risk factors for hepatitis C. Sexually transmitted infections (STIs)  Get screened for STIs, including gonorrhea and chlamydia, if: ? You are sexually active and are younger than 73 years of age. ? You are older than 73 years of age and your health care provider tells you that you are at risk for  this type of infection. ? Your sexual activity has changed since you were last screened, and you are at increased risk for chlamydia or gonorrhea. Ask your health care provider if you are at risk.  Ask your health care provider about whether you are at high risk for HIV. Your health care provider may recommend a prescription medicine to help prevent HIV infection. If you choose to take medicine to prevent HIV, you should first get tested for HIV. You should then be tested every 3 months for as long as you are taking the medicine. Pregnancy  If you are about to stop having your period (premenopausal) and you may become pregnant, seek counseling before you get pregnant.  Take 400 to 800 micrograms (mcg) of folic acid every day if you become pregnant.  Ask for birth control (contraception) if you want to prevent pregnancy. Osteoporosis and menopause Osteoporosis is a disease in which the bones lose minerals and strength with aging. This can result in bone fractures. If you are 87 years old or older, or if you are at risk for osteoporosis and fractures, ask your health care provider if you should:  Be screened for bone loss.  Take a calcium or vitamin D supplement to lower your risk of fractures.  Be given hormone replacement therapy (HRT) to treat symptoms of menopause. Follow these instructions at home: Lifestyle  Do not use any products that contain nicotine or tobacco, such as cigarettes, e-cigarettes, and chewing tobacco. If you need help quitting, ask your health care provider.  Do not use street drugs.  Do not share needles.  Ask your health care provider for help if you need support or information about quitting drugs. Alcohol use  Do not drink alcohol if: ? Your health care provider tells you not to drink. ? You are pregnant, may be pregnant, or are planning to become pregnant.  If you drink alcohol: ? Limit how much you use to 0-1 drink a day. ? Limit intake if you are  breastfeeding.  Be aware of how much alcohol is in your drink. In the [place] one drink equals one 12 oz bottle of beer (355 mL), one 5 oz glass of wine (148 mL), or one 1 oz glass of hard liquor (44 mL). General instructions  Schedule regular health, dental, and eye exams.  Stay current with your vaccines.  Tell your health care provider if: ? You often feel depressed. ? You have ever been abused or do not feel safe at home. Summary  Adopting a healthy lifestyle and getting preventive care are important in promoting health and wellness.  Follow your health care provider's instructions about healthy diet, exercising, and getting tested or screened for diseases.  Follow your health care provider's instructions on monitoring your cholesterol and blood pressure. This information is not intended to replace advice given to you by your health care provider. Make sure you discuss any questions you have with your health care provider. Document Revised: 10/24/2018 Document Reviewed: 10/24/2018 Elsevier Patient Education  2021 [employer].

## 2021-03-21 NOTE — Progress Notes (Signed)
Results are normal   liver kidney and  blood count and thyroid( blood sugar ok   for non fasting )  except cholesterol up from last year . Attention to  life style exercise etc   The 10-year ASCVD risk score Denman George DC Montez Hageman., et al., 2013) is: 11.9%   Values used to calculate the score:     Age: 75 years     Sex: Female     Is Non-Hispanic African American: No     Diabetic: No     Tobacco smoker: No     Systolic Blood Pressure: 120 mmHg     Is BP treated: No     HDL Cholesterol: 77.5 mg/dL     Total Cholesterol: 264 mg/dL

## 2021-03-25 DIAGNOSIS — H524 Presbyopia: Secondary | ICD-10-CM | POA: Diagnosis not present

## 2021-04-19 DIAGNOSIS — Z20822 Contact with and (suspected) exposure to covid-19: Secondary | ICD-10-CM | POA: Diagnosis not present

## 2021-09-15 ENCOUNTER — Other Ambulatory Visit: Payer: Self-pay | Admitting: Internal Medicine
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
## 2021-09-29 ENCOUNTER — Ambulatory Visit (INDEPENDENT_AMBULATORY_CARE_PROVIDER_SITE_OTHER): Payer: Medicare Other | Admitting: Family Medicine

## 2021-09-29 ENCOUNTER — Encounter: Payer: Self-pay | Admitting: Family Medicine

## 2021-09-29 ENCOUNTER — Other Ambulatory Visit: Payer: Self-pay

## 2021-09-29 VITALS — BP 118/82 | HR 71 | Temp 98.2°F | Resp 16 | Ht 64.0 in | Wt 107.8 lb

## 2021-09-29 DIAGNOSIS — M7989 Other specified soft tissue disorders: Secondary | ICD-10-CM | POA: Diagnosis not present

## 2021-09-29 DIAGNOSIS — W1642XA Fall into unspecified water causing other injury, initial encounter: Secondary | ICD-10-CM | POA: Diagnosis not present
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
## 2021-10-12 ENCOUNTER — Ambulatory Visit: Payer: Medicare Other | Admitting: Internal Medicine

## 2021-11-16 DIAGNOSIS — Z1231 Encounter for screening mammogram for malignant neoplasm of breast: Secondary | ICD-10-CM | POA: Diagnosis not present

## 2021-11-16 LAB — HM MAMMOGRAPHY

## 2021-11-26 ENCOUNTER — Encounter: Payer: Self-pay | Admitting: Internal Medicine

## 2021-12-08 ENCOUNTER — Encounter: Payer: Self-pay | Admitting: Internal Medicine

## 2021-12-08 ENCOUNTER — Ambulatory Visit (INDEPENDENT_AMBULATORY_CARE_PROVIDER_SITE_OTHER): Payer: Medicare Other | Admitting: Internal Medicine

## 2021-12-08 VITALS — BP 124/80 | HR 76 | Temp 98.7°F | Ht 64.0 in | Wt 110.4 lb

## 2021-12-08 DIAGNOSIS — R2241 Localized swelling, mass and lump, right lower limb: Secondary | ICD-10-CM

## 2021-12-08 DIAGNOSIS — Z8616 Personal history of COVID-19: Secondary | ICD-10-CM

## 2021-12-08 NOTE — Patient Instructions (Addendum)
I do not see  risk of DVT.   (Which is a deep vein problems .  )  possible  you will feel this for a long time  if had  subskin tissue injury ( fat necrosis or  or olf hematomas ) but this is not dangerous to your health.  If you see total leg swelling or   increase in sizes we can look again  and or get  sports medicine to  evaluated .   Otherwise  no intervention.

## 2021-12-08 NOTE — Progress Notes (Signed)
Chief Complaint  Patient presents with   lumps on legs    HPI: Sheri Jensen 76 y.o. come in for problem  based visit  [place] and had injury to the right lateral thigh 1 abrasion distally but then later lots and lots of bruising on the right lateral thigh. She saw Dr. Martinique about 6 weeks later because there was a bump under the skin.  Dr. Martinique assessed it is a benign process such as a lipoma fibroma but no evidence of DVT.  Although it does not interfere with her activities she can still feel a lumpiness in Along the right lateral thigh.  And has concerns.  Bruising is mostly gone although skin color change faintly.  It is not tender.  FYI did go on a cruise June 2022 and got a very mild case of COVID on the ship.  Last booster was May 2022 asks about other boosters.  No history of side effects. ROS: See pertinent positives and negatives per HPI.  Past Medical History:  Diagnosis Date   Depression    Hyperlipidemia    Hypertension     Family History  Problem Relation Age of Onset   Crohn's disease Daughter    Cancer Maternal Grandfather        colon   Colon cancer Maternal Grandfather    Diabetes Paternal Grandfather    Deafness Grandchild        congenital   Esophageal cancer Neg Hx    Pancreatic cancer Neg Hx    Stomach cancer Neg Hx     Social History   Socioeconomic History   Marital status: Married    Spouse name: Not on file   Number of children: Not on file   Years of education: Not on file   Highest education level: Not on file  Occupational History   Not on file  Tobacco Use   Smoking status: Never   Smokeless tobacco: Never  Vaping Use   Vaping Use: Never used  Substance and Sexual Activity   Alcohol use: Yes    Comment: 2 glasses of wine daily    Drug use: No   Sexual activity: Not on file  Other Topics Concern   Not on file  Social History Narrative   No tobacco    hh  of 2   A cat    etoh 2-3 wine  per day    With meals    Married one son and one daughter   Exercise well.    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     calcium-vitamin D (OSCAL) 250-125 MG-UNIT per tablet Take 1 tablet by mouth daily. Reported on 12/25/2015     Chlorphen-Pseudoephed-APAP (CORICIDIN D PO) Take by mouth as needed.     diphenhydrAMINE (BENADRYL) 25 MG tablet Take 1 tablet by mouth as needed.     DM-Doxylamine-Acetaminophen (NYQUIL COLD & FLU PO) Take by mouth as needed.     ibuprofen (ADVIL) 100 MG tablet Take 100  mg by mouth every 6 (six) hours as needed for fever.     Multiple Vitamins-Minerals (WOMENS ONE DAILY PO) Take by mouth daily.     sertraline (ZOLOFT) 100 MG tablet TAKE 1 TABLET BY MOUTH EVERY DAY IF TOLERATED 90 tablet 1   Simethicone 125 MG CAPS Take by mouth as needed.     famotidine (PEPCID) 20 MG tablet Take 20 mg by mouth daily as needed for heartburn or indigestion. (Patient not taking: Reported on 12/08/2021)     Melatonin 5 MG CAPS Take by mouth at bedtime as needed. (Patient not taking: Reported on 12/08/2021)     No facility-administered medications prior to visit.     EXAM:  BP 124/80 (BP Location: Left Arm, Patient Position: Sitting, Cuff Size: Normal)    Pulse 76    Temp 98.7 F (37.1 C) (Oral)    Ht 5\' 4"  (1.626 m)    Wt 110 lb 6.4 oz (50.1 kg)    SpO2 99%    BMI 18.95 kg/m   Body mass index is 18.95 kg/m.  GENERAL: vitals reviewed and listed above, alert, oriented, appears well hydrated and in no acute distress HEENT: atraumatic, conjunctiva  clear, no obvious  abnormalities on inspection of external nose and ears OP : masked MS: moves all extremities normal gait right lateral thigh middle to proximal there is a 1 and half centimeter palpable nodule that feels the most cystic nontender no redness no dimpling of skin.  And then on palpation there is a bumpiness along the right lateral thigh area but no tenderness or masses.  Circulation appears normal PSYCH: pleasant and cooperative, no obvious depression or anxiety  BP Readings from Last 3 Encounters:  12/08/21 124/80  09/29/21 118/82  03/17/21 120/66   Reviewed note from Dr. Martinique November. ASSESSMENT AND PLAN:  Discussed the following assessment and plan:  Lump of right thigh - post traumatic prob, based on hx;no sign of dv ( could have had superfical phebitis)  consider fat necrosis or oldl [place] hematoma .one lump feels cystic [place]  History of COVID-19 - June 2022 mild on a cruise History of trauma to the right lower extremity 3 months ago normal function no alarming findings suspect what I am feeling is residual from deep contusions possibly resolving hematomas versus fat necrosis.  This is based on her history and the fact there was massive bruising according to her. Options discussed at this time would just continue to follow and this may remain the same long-term if it is not progressing increasing loss of function pain etc. there should be no concerns. If progressing in someway consider ultrasound or other imaging but I do not think that is necessary to her health today.  Uncertain about COVID boosters at least wait 6 months between illnesses and boosters lack of robust clinical data about effectiveness at this time but up to her as she is not high risk except for age. -Patient advised to return or notify health care team  if  new concerns arise.  Patient Instructions  I do not see  risk of DVT.   (Which is a deep vein problems .  )  possible  you will feel this for a long time  if had   subskin tissue injury ( fat necrosis or  or olf hematomas ) but this is not dangerous to your health.  If you see total leg swelling or   increase in sizes we can look again  and or get  sports medicine to  evaluated .   Otherwise  no intervention.        Standley Brooking. Johnell Bas M.D.

## 2022-03-23 ENCOUNTER — Other Ambulatory Visit: Payer: Self-pay | Admitting: Internal Medicine

## 2022-03-23 NOTE — Telephone Encounter (Signed)
Last  Ov 12/08/21 ?Filled 09/15/21 ?Is it ok to refill? ?

## 2022-08-02 ENCOUNTER — Emergency Department (HOSPITAL_BASED_OUTPATIENT_CLINIC_OR_DEPARTMENT_OTHER): Payer: Medicare Other

## 2022-08-02 ENCOUNTER — Encounter (HOSPITAL_BASED_OUTPATIENT_CLINIC_OR_DEPARTMENT_OTHER): Payer: Self-pay

## 2022-08-02 ENCOUNTER — Other Ambulatory Visit: Payer: Self-pay

## 2022-08-02 ENCOUNTER — Emergency Department (HOSPITAL_BASED_OUTPATIENT_CLINIC_OR_DEPARTMENT_OTHER)
Admission: EM | Admit: 2022-08-02 | Discharge: 2022-08-02 | Disposition: A | Discharge status: Home / Self Care | Payer: Medicare Other | Attending: Emergency Medicine | Admitting: Emergency Medicine

## 2022-08-02 DIAGNOSIS — J029 Acute pharyngitis, unspecified: Secondary | ICD-10-CM | POA: Diagnosis not present

## 2022-08-02 DIAGNOSIS — Z1152 Encounter for screening for COVID-19: Secondary | ICD-10-CM | POA: Diagnosis not present

## 2022-08-02 DIAGNOSIS — I700 Atherosclerosis of aorta: Secondary | ICD-10-CM | POA: Insufficient documentation

## 2022-08-02 DIAGNOSIS — Z20822 Contact with and (suspected) exposure to covid-19: Secondary | ICD-10-CM | POA: Diagnosis not present

## 2022-08-02 DIAGNOSIS — R1110 Vomiting, unspecified: Secondary | ICD-10-CM | POA: Diagnosis not present

## 2022-08-02 DIAGNOSIS — R109 Unspecified abdominal pain: Secondary | ICD-10-CM | POA: Diagnosis not present

## 2022-08-02 DIAGNOSIS — M47816 Spondylosis without myelopathy or radiculopathy, lumbar region: Secondary | ICD-10-CM | POA: Diagnosis not present

## 2022-08-02 DIAGNOSIS — K429 Umbilical hernia without obstruction or gangrene: Secondary | ICD-10-CM | POA: Diagnosis not present

## 2022-08-02 DIAGNOSIS — R112 Nausea with vomiting, unspecified: Secondary | ICD-10-CM | POA: Insufficient documentation

## 2022-08-02 DIAGNOSIS — R1030 Lower abdominal pain, unspecified: Secondary | ICD-10-CM | POA: Diagnosis not present

## 2022-08-02 LAB — CBC WITH DIFFERENTIAL/PLATELET
Abs Immature Granulocytes: 0.02 10*3/uL (ref 0.00–0.07)
Basophils Absolute: 0 10*3/uL (ref 0.0–0.1)
Basophils Relative: 1 %
Eosinophils Absolute: 0 10*3/uL (ref 0.0–0.5)
Eosinophils Relative: 1 %
HCT: 42.8 % (ref 36.0–46.0)
Hemoglobin: 14.6 g/dL (ref 12.0–15.0)
Immature Granulocytes: 0 %
Lymphocytes Relative: 25 %
Lymphs Abs: 1.8 10*3/uL (ref 0.7–4.0)
MCH: 32.5 pg (ref 26.0–34.0)
MCHC: 34.1 g/dL (ref 30.0–36.0)
MCV: 95.3 fL (ref 80.0–100.0)
Monocytes Absolute: 0.4 10*3/uL (ref 0.1–1.0)
Monocytes Relative: 6 %
Neutro Abs: 5 10*3/uL (ref 1.7–7.7)
Neutrophils Relative %: 67 %
Platelets: 304 10*3/uL (ref 150–400)
RBC: 4.49 MIL/uL (ref 3.87–5.11)
RDW: 13.2 % (ref 11.5–15.5)
WBC: 7.3 10*3/uL (ref 4.0–10.5)
nRBC: 0 % (ref 0.0–0.2)

## 2022-08-02 LAB — COMPREHENSIVE METABOLIC PANEL
ALT: 11 U/L (ref 0–44)
AST: 20 U/L (ref 15–41)
Albumin: 5.4 g/dL — ABNORMAL HIGH (ref 3.5–5.0)
Alkaline Phosphatase: 62 U/L (ref 38–126)
Anion gap: 21 — ABNORMAL HIGH (ref 5–15)
BUN: 21 mg/dL (ref 8–23)
CO2: 18 mmol/L — ABNORMAL LOW (ref 22–32)
Calcium: 10.6 mg/dL — ABNORMAL HIGH (ref 8.9–10.3)
Chloride: 101 mmol/L (ref 98–111)
Creatinine, Ser: 0.78 mg/dL (ref 0.44–1.00)
GFR, Estimated: >60 mL/min (ref >60)
Glucose, Bld: 93 mg/dL (ref 70–99)
Potassium: 3.6 mmol/L (ref 3.5–5.1)
Sodium: 140 mmol/L (ref 135–145)
Total Bilirubin: 0.9 mg/dL (ref 0.3–1.2)
Total Protein: 8.8 g/dL — ABNORMAL HIGH (ref 6.5–8.1)

## 2022-08-02 LAB — BASIC METABOLIC PANEL
Anion gap: 19 — ABNORMAL HIGH (ref 5–15)
BUN: 19 mg/dL (ref 8–23)
CO2: 17 mmol/L — ABNORMAL LOW (ref 22–32)
Calcium: 9 mg/dL (ref 8.9–10.3)
Chloride: 103 mmol/L (ref 98–111)
Creatinine, Ser: 0.63 mg/dL (ref 0.44–1.00)
GFR, Estimated: >60 mL/min (ref >60)
Glucose, Bld: 80 mg/dL (ref 70–99)
Potassium: 3.3 mmol/L — ABNORMAL LOW (ref 3.5–5.1)
Sodium: 139 mmol/L (ref 135–145)

## 2022-08-02 LAB — TROPONIN I (HIGH SENSITIVITY): Troponin I (High Sensitivity): 16 ng/L (ref <18)

## 2022-08-02 LAB — URINALYSIS, ROUTINE W REFLEX MICROSCOPIC
Bilirubin Urine: NEGATIVE
Glucose, UA: NEGATIVE mg/dL
Hgb urine dipstick: NEGATIVE
Ketones, ur: 40 mg/dL — AB
Leukocytes,Ua: NEGATIVE
Nitrite: NEGATIVE
Protein, ur: NEGATIVE mg/dL
Specific Gravity, Urine: 1.028 (ref 1.005–1.030)
pH: 6.5 (ref 5.0–8.0)

## 2022-08-02 LAB — RESP PANEL BY RT-PCR (FLU A&B, COVID) ARPGX2
Influenza A by PCR: NEGATIVE
Influenza B by PCR: NEGATIVE
SARS Coronavirus 2 by RT PCR: NEGATIVE

## 2022-08-02 LAB — GROUP A STREP BY PCR: Group A Strep by PCR: NOT DETECTED

## 2022-08-02 LAB — SALICYLATE LEVEL: Salicylate Lvl: <7 mg/dL — ABNORMAL LOW (ref 7.0–30.0)

## 2022-08-02 LAB — LIPASE, BLOOD: Lipase: 33 U/L (ref 11–51)

## 2022-08-02 LAB — LACTIC ACID, PLASMA: Lactic Acid, Venous: 1.4 mmol/L (ref 0.5–1.9)

## 2022-08-02 MED ORDER — ONDANSETRON HCL 4 MG/2ML IJ SOLN
4.0000 mg | Freq: Once | INTRAMUSCULAR | Status: AC
  Administered 2022-08-02: 4 mg via INTRAVENOUS
  Filled 2022-08-02: qty 2

## 2022-08-02 MED ORDER — ALUM & MAG HYDROXIDE-SIMETH 200-200-20 MG/5ML PO SUSP
30.0000 mL | Freq: Once | ORAL | Status: AC
  Administered 2022-08-02: 30 mL via ORAL
  Filled 2022-08-02: qty 30

## 2022-08-02 MED ORDER — PANTOPRAZOLE SODIUM 40 MG IV SOLR
40.0000 mg | Freq: Once | INTRAVENOUS | Status: AC
  Administered 2022-08-02: 40 mg via INTRAVENOUS
  Filled 2022-08-02: qty 10

## 2022-08-02 MED ORDER — ONDANSETRON 4 MG PO TBDP: 4.0000 mg | ORAL_TABLET | Freq: Three times a day (TID) | ORAL | 0 refills | Status: DC | PRN

## 2022-08-02 MED ORDER — PROCHLORPERAZINE EDISYLATE 10 MG/2ML IJ SOLN
10.0000 mg | Freq: Once | INTRAMUSCULAR | Status: AC
  Administered 2022-08-02: 10 mg via INTRAVENOUS
  Filled 2022-08-02: qty 2

## 2022-08-02 MED ORDER — POTASSIUM CHLORIDE CRYS ER 20 MEQ PO TBCR: 20.0000 meq | EXTENDED_RELEASE_TABLET | Freq: Two times a day (BID) | ORAL | 0 refills | Status: DC

## 2022-08-02 MED ORDER — FENTANYL CITRATE PF 50 MCG/ML IJ SOSY
50.0000 ug | PREFILLED_SYRINGE | Freq: Once | INTRAMUSCULAR | Status: AC
  Administered 2022-08-02: 50 ug via INTRAVENOUS
  Filled 2022-08-02: qty 1

## 2022-08-02 MED ORDER — SODIUM CHLORIDE 0.9 % IV BOLUS
1000.0000 mL | Freq: Once | INTRAVENOUS | Status: AC
  Administered 2022-08-02: 1000 mL via INTRAVENOUS

## 2022-08-02 MED ORDER — DIPHENHYDRAMINE HCL 50 MG/ML IJ SOLN
25.0000 mg | Freq: Once | INTRAMUSCULAR | Status: AC
  Administered 2022-08-02: 25 mg via INTRAVENOUS
  Filled 2022-08-02: qty 1

## 2022-08-02 MED ORDER — IOHEXOL 300 MG/ML  SOLN
100.0000 mL | Freq: Once | INTRAMUSCULAR | Status: AC | PRN
  Administered 2022-08-02: 75 mL via INTRAVENOUS

## 2022-08-02 MED ORDER — PANTOPRAZOLE SODIUM 20 MG PO TBEC: 40.0000 mg | DELAYED_RELEASE_TABLET | Freq: Every day | ORAL | 0 refills | Status: DC

## 2022-08-02 NOTE — ED Notes (Signed)
Dr Ronnald Nian hospitalist called CL jamie 14:58 AOM

## 2022-08-02 NOTE — ED Notes (Signed)
RN provided AVS using Teachback Method. Patient verbalizes understanding of Discharge Instructions. Opportunity for Questioning and Answers were provided by RN. Patient Discharged from ED ambulatory to Home with Family. ? ?

## 2022-08-02 NOTE — ED Provider Notes (Signed)
Past Medical History:  Diagnosis Date   Depression    Hyperlipidemia    Hypertension     Physical Exam  BP (!) 146/73   Pulse 70   Temp 97.6 F (36.4 C) (Oral)   Resp 16   Ht 5\' 4"  (1.626 m)   Wt 50.1 kg   SpO2 96%   BMI 18.96 kg/m   Physical Exam  Procedures  Procedures  ED Course / MDM      Received care of patient from Dr. Ronnald Nian.  Please see his note for prior history, physical and care.  Briefly this is a 76 year old female with a history of hypertension, hyperlipidemia who presented with concern for nausea, vomiting, throat pain and abdominal pain.  CT abdomen pelvis was completed and showed no evidence of acute bowel obstruction or other acute abnormalities.  Labs are completed and personally evaluated interpreted by me which showed mild anion gap metabolic acidosis, hyper albuminemia.  No leukocytosis, no anemia, negative strep, COVID, flu testing.  She did have a blood gas that was completed although does not show in the computer that showed a pH of 7.5, was noted to be hyperventilating at that time.  Salicylate testing was done and was negative.  Lactic acid is normal.  Urinalysis returned with ketonuria.  Suspect her anion gap is secondary to ketones in the setting of starvation ketoacidosis.  She has received IV fluids, medications to help with reflux symptoms and nausea.  She had been offered admission for continued symptoms at time of Dr. Sanjuana Kava evaluation, however she felt better and declined admission.  We also added on EKG, and troponin which both did not show clinically significant findings and do not feel symptoms are secondary to ACS.  At time of my evaluation, she is well-appearing, reports she feels significantly improved, is tolerating p.o. fluids without vomiting and does not have the pain that she had previously.  She is able to tolerate p.o., I feel this will help with her starvation ketoacidosis.  Will discharge with prescription for Zofran, PPI.   Recommend PCP follow-up.  Patient discharged in stable condition with understanding of reasons to return.    Gareth Morgan, MD 08/02/22 1624

## 2022-08-02 NOTE — ED Notes (Signed)
Patient states her nausea has come back

## 2022-08-02 NOTE — ED Triage Notes (Signed)
Patient here POV from Home.  Endorses N/V for approximately 2 Days. Symptoms have Continued since they began. Dull Pain to Lower ABD.  No Known Fevers. Some Chills. No Diarrhea.   NAD Noted during Triage. A&Ox4. GCS 15. BIB Wheelchair.

## 2022-08-02 NOTE — ED Provider Notes (Signed)
[facility] EMERGENCY DEPT Provider Note   CSN: [id number] Arrival date & time: 08/02/22  1034     History  Chief Complaint  Patient presents with   Emesis    Sheri Jensen is a 76 y.o. female.  Patient here with nausea, vomiting, abdominal pain for the last 2 days.  Sore throat.  No suspicious food intake or sick contacts.  History of high cholesterol, hypertension, depression.  No major abdominal surgeries.  Nothing makes it worse or better.  Denies any chest pain or shortness of breath or cough.  Denies any pain with urination.  The history is provided by the patient.       Home Medications Prior to Admission medications   Medication Sig Start Date End Date Taking? Authorizing Provider  acetaminophen (TYLENOL) 325 MG tablet Take 650 mg by mouth every 6 (six) hours as needed.    [provider]  calcium-vitamin D (OSCAL) 250-125 MG-UNIT per tablet Take 1 tablet by mouth daily. Reported on 12/25/2015    [provider]  Chlorphen-Pseudoephed-APAP (CORICIDIN D PO) Take by mouth as needed.    [provider]  diphenhydrAMINE (BENADRYL) 25 MG tablet Take 1 tablet by mouth as needed.    [provider]  DM-Doxylamine-Acetaminophen (NYQUIL COLD & FLU PO) Take by mouth as needed.    [provider]  ibuprofen (ADVIL) 100 MG tablet Take 100 mg by mouth every 6 (six) hours as needed for fever.    [provider]  Multiple Vitamins-Minerals (WOMENS ONE DAILY PO) Take by mouth daily.    [provider]  sertraline (ZOLOFT) 100 MG tablet TAKE 1 TABLET BY MOUTH EVERY DAY IF TOLERATED 03/23/22   Panosh, [name], MD  Simethicone 125 MG CAPS Take by mouth as needed.    [provider]      Allergies    Patient has no known allergies.    Review of Systems   Review of Systems  Physical Exam Updated Vital Signs BP (!) 173/90   Pulse (!) 58   Temp 97.6 F (36.4 C) (Oral)   Resp 14   Ht 5\' 4"  (1.626  m)   Wt 50.1 kg   SpO2 100%   BMI 18.96 kg/m  Physical Exam Vitals and nursing note reviewed.  Constitutional:      General: She is not in acute distress.    Appearance: She is well-developed. She is not ill-appearing.  HENT:     Head: Normocephalic and atraumatic.     Nose: Nose normal.     Mouth/Throat:     Mouth: Mucous membranes are moist.  Eyes:     Extraocular Movements: Extraocular movements intact.     Conjunctiva/sclera: Conjunctivae normal.     Pupils: Pupils are equal, round, and reactive to light.  Cardiovascular:     Rate and Rhythm: Normal rate and regular rhythm.     Pulses: Normal pulses.     Heart sounds: Normal heart sounds. No murmur heard. Pulmonary:     Effort: Pulmonary effort is normal. No respiratory distress.     Breath sounds: Normal breath sounds.  Abdominal:     Palpations: Abdomen is soft.     Tenderness: There is abdominal tenderness.  Musculoskeletal:        General: No swelling.     Cervical back: Neck supple.  Skin:    General: Skin is warm and dry.     Capillary Refill: Capillary refill takes less than 2 seconds.  Neurological:     General: No focal deficit present.     Mental Status: She is alert.  Psychiatric:        Mood and Affect: Mood normal.     ED Results / Procedures / Treatments   Labs (all labs ordered are listed, but only abnormal results are displayed) Labs Reviewed  COMPREHENSIVE METABOLIC PANEL - Abnormal; Notable for the following components:      Result Value   CO2 18 (*)    Calcium 10.6 (*)    Total Protein 8.8 (*)    Albumin 5.4 (*)    Anion gap 21 (*)    All other components within normal limits  BASIC METABOLIC PANEL - Abnormal; Notable for the following components:   Potassium 3.3 (*)    CO2 17 (*)    Anion gap 19 (*)    All other components within normal limits  RESP PANEL BY RT-PCR (FLU A&B, COVID) [id number]  GROUP A STREP BY PCR  CBC WITH DIFFERENTIAL/PLATELET  LIPASE, BLOOD  LACTIC ACID, PLASMA   URINALYSIS, ROUTINE W REFLEX MICROSCOPIC  SALICYLATE LEVEL    EKG None  Radiology CT ABDOMEN PELVIS W CONTRAST  Result Date: 08/02/2022 CLINICAL DATA:  Abdominal pain, acute nonlocalized. Nausea and vomiting for 2 days. Dull lower abdominal pain. EXAM: CT ABDOMEN AND PELVIS WITH CONTRAST TECHNIQUE: Multidetector CT imaging of the abdomen and pelvis was performed using the standard protocol following bolus administration of intravenous contrast. RADIATION DOSE REDUCTION: This exam was performed according to the departmental dose-optimization program which includes automated exposure control, adjustment of the mA and/or kV according to patient size and/or use of iterative reconstruction technique. CONTRAST:  38mL OMNIPAQUE IOHEXOL 300 MG/ML  SOLN COMPARISON:  Abdominal ultrasound 01/06/2016. FINDINGS: Lower chest: Mild linear scarring at both lung bases. Trace pericardial fluid and aortic atherosclerosis. Hepatobiliary: The liver is normal in density without suspicious focal abnormality. No evidence of gallstones, gallbladder wall thickening or biliary dilatation. Pancreas: Unremarkable. No pancreatic ductal dilatation or surrounding inflammatory changes. Spleen: Normal in size without focal abnormality. Adrenals/Urinary Tract: Both adrenal glands appear normal. Both kidneys demonstrate mild cortical thinning. No evidence of renal mass, urinary tract calculus or hydronephrosis. The bladder appears unremarkable for its degree of distention. Stomach/Bowel: Enteric contrast was administered and has passed into the mid transverse colon. Probable small gastric fundal diverticulum. No evidence of bowel wall thickening, distention or surrounding inflammation. The colon is incompletely distended and suboptimally evaluated. There are distal colonic diverticula. Vascular/Lymphatic: There are no enlarged abdominal or pelvic lymph nodes. Aortic and branch vessel atherosclerosis. Reproductive: Probable small anterior  uterine fibroid. No adnexal mass. Other: Small umbilical hernia containing only fat. No ascites or free air. Musculoskeletal: No acute or significant osseous findings. Degenerative changes in the lumbar spine associated with a convex right thoracolumbar scoliosis. IMPRESSION: 1. No definite acute findings or explanation for the patient's symptoms. Distal colonic diverticulosis without evidence of acute inflammation or bowel obstruction. 2. Bilateral renal cortical scarring. 3.  Aortic Atherosclerosis (ICD10-I70.0). Electronically Signed   By: Richardean Sale M.D.   On: 08/02/2022 14:02    Procedures Procedures    Medications Ordered in ED Medications  pantoprazole (PROTONIX) injection 40 mg (has no administration in time range)  ondansetron (ZOFRAN) injection 4 mg (4 mg Intravenous Given 08/02/22 1154)  sodium chloride 0.9 % bolus 1,000 mL (0 mLs Intravenous Stopped 08/02/22 1300)  fentaNYL (SUBLIMAZE) injection 50 mcg (50 mcg Intravenous Given 08/02/22 1154)  iohexol (OMNIPAQUE) 300 MG/ML solution  100 mL (75 mLs Intravenous Contrast Given 08/02/22 1331)  sodium chloride 0.9 % bolus 1,000 mL (1,000 mLs Intravenous New Bag/Given 08/02/22 1405)  ondansetron (ZOFRAN) injection 4 mg (4 mg Intravenous Given 08/02/22 1409)  alum & mag hydroxide-simeth (MAALOX/MYLANTA) 200-200-20 MG/5ML suspension 30 mL (30 mLs Oral Given 08/02/22 1423)    ED Course/ Medical Decision Making/ A&P                           Medical Decision Making Amount and/or Complexity of Data Reviewed Labs: ordered. Radiology: ordered.  Risk OTC drugs. Prescription drug management. Decision regarding hospitalization.   MARYEM SHUFFLER is here with abdominal pain, nausea, vomiting.  Differential diagnosis is viral process versus foodborne illness versus strep versus less likely pancreatitis, cholecystitis, appendicitis, colitis, bowel obstruction.  We will get CBC, CMP, lipase, urinalysis, CT scan abdomen pelvis, COVID test, strep  test.  We will give IV fluid bolus, IV Zofran, IV fentanyl and reevaluate.  Lab work shows no significant anemia, leukocytosis per my review interpretation.  COVID and flu test are negative.  Strep test is negative.  Bicarb is 18, anion gap is 21.  Blood gases 7.5.  Patient is hyperventilating.  She has no significant findings on CT scan abdomen and pelvis per radiology report.  I have given her additional liter of IV fluids.  We will recheck a BMP and lactic acid.    My suspicion is that this is likely a viral process or reflux/foodborne illness.  Patient with repeat BMP with bicarb of 17, anion gap of 19.  I suspect this is partly from hyperventilation and partly from GI losses.  She is still very uncomfortable with nausea and abdominal discomfort.  I suspect this could be due to some inflammation/esophagitis, gastritis.  We will give a dose IV Protonix and put her on some maintenance fluids.  She is able to tolerate a little bit of fluids but ultimately she does not feel great about going home which I agree with.  I think it is reasonable to keep her overnight for hydration, symptomatic support.  This chart was dictated using voice recognition software.  Despite best efforts to proofread,  errors can occur which can change the documentation meaning.          Final Clinical Impression(s) / ED Diagnoses Final diagnoses:  Nausea and vomiting, unspecified vomiting type    Rx / DC Orders ED Discharge Orders     None         Virgina Norfolk, DO 08/02/22 1457

## 2022-08-11 ENCOUNTER — Telehealth: Payer: Self-pay | Admitting: *Deleted

## 2022-08-11 NOTE — Telephone Encounter (Signed)
     Patient  visit on 08/02/2022  at [facility] was for vomiting  Have you been able to follow up with your primary care physician? No feeling better  The patient was able to obtain any needed medicine or equipment.  Are there diet recommendations that you are having difficulty following?  Patient expresses understanding of discharge instructions and education provided has no other needs at this time.    [place] [phone] [address] , [place] [address] Email : [email or web address]

## 2022-09-21 NOTE — Progress Notes (Unsigned)
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 Source of recent symptoms    - ? 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 Reflux versus functional dyspepsia Chronic constipation  Colon cancer screening    - Normal colonoscopy 2000 and  2010.    - Cologard negative 04/2020   PLAN: - Start omeprazole 40 mg QAM  - EGD to further evaluate gastric diverticulum and exclude concurrent symptomatic etiologies such as esophagitis or gastritis  Please see the "Patient Instructions" section for addition details about the plan.   HPI: Sheri Jensen is a 76 y.o. female returns in follow-up with recent concerns for nausea and vomiting. She was last seen 11/24/20.  The interval history is obtained through the patient, her husband who accompanies her to this appointment, and review of her electronic health record.  Initial consult 07/29/20: Previously seen for post-prandial eructation, that had become more bothersome over the last 2-3 years. It was occcuring immediately after eating, resolved within minutes and improved when sitting upright.  Some association with anxiety and frequent sighing. She does not chew gum, smoke, drink carbonated beverages, or eat quickly gulping foods and liquids. Embarrassed by the symptoms, although they did not prevent her from eating out or with other people. Wished to avoid endoscopy.     Celiac panel negative 01/2020 Ferritin, iron, and hemoglobin normal 01/2020  Abdominal ultrasound obtained for nausea, intractable  vomiting, and eructation in 2017 was normal.   Office visit 11/24/20.: Returns in follow-up reporting a resolution in her symptoms.  Trial of Pepcid provided no significant relief. Headache with FDGard. Unable to find the pantoprazole for a PPI trial. Continues to wish to avoid endoscopy. Stopped drinking a glass of wine prior to dinner. Her husband even notices that she is better. Does better with wine during meal.   Urgent Care visit 08/02/22 for nausea, vomiting, dyphagia and abdominal pain. Labs showed normal lipase, liver enzymes, and CBC.  CT showed a small gastric fundal diverticulum, small umbilical hernia containing fat, but no source of symptoms.  She was treated with IV pantoprazole, ondansetron, and prochlorperazine.   Office visit 09/22/22: She has continued to have intermittent nausea and vomiting similar to her symptoms resulting in [facility] visit 08/02/22, although not as severe, and not lasting for as long. She also had an episode of odynophagia when eating peanut butter.  When these episodes occur bowel rest, avoiding medications, and resting/relaxing result in improvement. She is concerned that she is eating too many small, sweet peppers and is hopeful that more evaluation is not needed. Symptoms despite simethicone.   Colon cancer screening: Normal colonoscopy 2000 and  2010. Cologard negative 04/2020  Past Medical History:  Diagnosis Date   Depression    Hyperlipidemia    Hypertension     Past Surgical History:  Procedure Laterality Date   chidbirth     x2   COLONOSCOPY  03/00   2/10- normal     Current Outpatient Medications  Medication Sig Dispense Refill   acetaminophen (  TYLENOL) 325 MG tablet Take 650 mg by mouth every 6 (six) hours as needed.     calcium-vitamin D (OSCAL) 250-125 MG-UNIT per tablet Take 1 tablet by mouth daily. Reported on 12/25/2015     diphenhydrAMINE (BENADRYL) 25 MG tablet Take 1 tablet by mouth as needed.     DM-Doxylamine-Acetaminophen (NYQUIL  COLD & FLU PO) Take by mouth as needed.     ibuprofen (ADVIL) 100 MG tablet Take 100 mg by mouth every 6 (six) hours as needed for fever.     Multiple Vitamins-Minerals (WOMENS ONE DAILY PO) Take by mouth daily.     omeprazole (PRILOSEC) 40 MG capsule Take 1 capsule (40 mg total) by mouth daily. 90 capsule 3   sertraline (ZOLOFT) 100 MG tablet TAKE 1 TABLET BY MOUTH EVERY DAY IF TOLERATED 90 tablet 1   Simethicone 125 MG CAPS Take by mouth as needed.     No current facility-administered medications for this visit.    Allergies as of 09/22/2022   (No Known Allergies)    Family History  Problem Relation Age of Onset   Crohn's disease Daughter    Cancer Maternal Grandfather        colon   Colon cancer Maternal Grandfather    Diabetes Paternal Grandfather    Deafness Grandchild        congenital   Esophageal cancer Neg Hx    Pancreatic cancer Neg Hx    Stomach cancer Neg Hx        Physical Exam: General:   Alert,  well-nourished, pleasant and cooperative in NAD Head:  Normocephalic and atraumatic. Eyes:  Sclera clear, no icterus.   Conjunctiva pink. Abdomen:  Soft, nontender, nondistended, normal bowel sounds, no rebound or guarding. No hepatosplenomegaly.   Neurologic:  Alert and  oriented x4;  grossly nonfocal Skin:  Intact without significant lesions or rashes. Psych:  Alert and cooperative. Normal mood and affect.  I spent 33 minutes, including in depth chart review, independent review of results, communicating results with the patient directly, face-to-face time with the patient, coordinating care, and ordering studies and medications as appropriate, and documentation.   Zharia Conrow L. Orvan Falconer, MD, MPH 09/22/2022, 8:34 PM

## 2022-09-22 ENCOUNTER — Ambulatory Visit: Payer: Medicare Other | Admitting: Gastroenterology

## 2022-09-22 ENCOUNTER — Encounter: Payer: Self-pay | Admitting: Gastroenterology

## 2022-09-22 VITALS — BP 134/82 | HR 74 | Ht 64.0 in | Wt 108.0 lb

## 2022-09-22 DIAGNOSIS — R1310 Dysphagia, unspecified: Secondary | ICD-10-CM

## 2022-09-22 DIAGNOSIS — R112 Nausea with vomiting, unspecified: Secondary | ICD-10-CM

## 2022-09-22 MED ORDER — OMEPRAZOLE 40 MG PO CPDR: 40.0000 mg | DELAYED_RELEASE_CAPSULE | Freq: Every day | ORAL | 3 refills | Status: DC

## 2022-09-22 NOTE — Patient Instructions (Addendum)
It was a pleasure to see you today.  We discussed starting omeprazole 40 mg every morning to try to prevent your symptoms from recurring.  We discussed an upper endoscopy to evaluate why this is happening.  Please send a MyChart with any questions or concerns prior to that time.

## 2022-10-19 ENCOUNTER — Encounter: Payer: Medicare Other | Admitting: Gastroenterology

## 2022-11-11 ENCOUNTER — Ambulatory Visit (AMBULATORY_SURGERY_CENTER): Payer: Medicare Other | Admitting: Gastroenterology

## 2022-11-11 ENCOUNTER — Encounter: Payer: Self-pay | Admitting: Gastroenterology

## 2022-11-11 VITALS — BP 157/78 | HR 63 | Temp 98.0°F | Resp 16 | Ht 64.0 in | Wt 108.0 lb

## 2022-11-11 DIAGNOSIS — K200 Eosinophilic esophagitis: Secondary | ICD-10-CM | POA: Diagnosis not present

## 2022-11-11 DIAGNOSIS — K317 Polyp of stomach and duodenum: Secondary | ICD-10-CM

## 2022-11-11 DIAGNOSIS — F32A Depression, unspecified: Secondary | ICD-10-CM | POA: Diagnosis not present

## 2022-11-11 DIAGNOSIS — R112 Nausea with vomiting, unspecified: Secondary | ICD-10-CM | POA: Diagnosis not present

## 2022-11-11 DIAGNOSIS — K2100 Gastro-esophageal reflux disease with esophagitis, without bleeding: Secondary | ICD-10-CM

## 2022-11-11 DIAGNOSIS — K219 Gastro-esophageal reflux disease without esophagitis: Secondary | ICD-10-CM | POA: Diagnosis not present

## 2022-11-11 DIAGNOSIS — K2950 Unspecified chronic gastritis without bleeding: Secondary | ICD-10-CM | POA: Diagnosis not present

## 2022-11-11 DIAGNOSIS — R1310 Dysphagia, unspecified: Secondary | ICD-10-CM | POA: Diagnosis not present

## 2022-11-11 DIAGNOSIS — K2289 Other specified disease of esophagus: Secondary | ICD-10-CM | POA: Diagnosis not present

## 2022-11-11 MED ORDER — SODIUM CHLORIDE 0.9 % IV SOLN: 500.0000 mL | Freq: Once | INTRAVENOUS | Status: DC

## 2022-11-11 MED ORDER — OMEPRAZOLE 40 MG PO CPDR: 40.0000 mg | DELAYED_RELEASE_CAPSULE | Freq: Two times a day (BID) | ORAL | 0 refills | Status: DC

## 2022-11-11 NOTE — Progress Notes (Signed)
Report to PACU, RN, vss, BBS= Clear.  

## 2022-11-11 NOTE — Op Note (Signed)
[facility] Patient Name: Sheri Jensen Procedure Date: 11/11/2022 11:12 AM MRN: [id number] Endoscopist: Tressia Danas MD, MD, [id number] Age: 76 Referring MD:  Date of Birth: [birth year] Gender: Female Account #: [id number] Procedure:                Upper GI endoscopy Indications:              Dysphagia, Odynophagia, Nausea with vomiting Medicines:                Monitored Anesthesia Care Procedure:                Pre-Anesthesia Assessment:                           - Prior to the procedure, a History and Physical                            was performed, and patient medications and                            allergies were reviewed. The patient's tolerance of                            previous anesthesia was also reviewed. The risks                            and benefits of the procedure and the sedation                            options and risks were discussed with the patient.                            All questions were answered, and informed consent                            was obtained. Prior Anticoagulants: The patient has                            taken no anticoagulant or antiplatelet agents. ASA                            Grade Assessment: II - A patient with mild systemic                            disease. After reviewing the risks and benefits,                            the patient was deemed in satisfactory condition to                            undergo the procedure.                           After obtaining informed consent, the endoscope was  passed under direct vision. Throughout the                            procedure, the patient's blood pressure, pulse, and                            oxygen saturations were monitored continuously. The                            Endoscope was introduced through the mouth, and                            advanced to the third part of duodenum. The upper                            GI  endoscopy was accomplished without difficulty.                            The patient tolerated the procedure well. Scope In: Scope Out: Findings:                 LA Grade A (one or more mucosal breaks less than 5                            mm, not extending between tops of 2 mucosal folds)                            esophagitis with no bleeding was found 36 cm from                            the incisors. No ring, web, or stricture. Biopsies                            were taken from the mid/proximal and distal                            esophagus with a cold forceps for histology.                            Estimated blood loss was minimal.                           Multiple small erosions with no bleeding and no                            stigmata of recent bleeding were found in the                            gastric antrum. Biopsies were taken from the                            antrum, body, and fundus with a cold forceps for  histology. Estimated blood loss was minimal.                           A few small sessile polyps with no stigmata of                            recent bleeding were found in the gastric fundus                            and in the gastric body. Biopsies were taken with a                            cold forceps for histology. Estimated blood loss                            was minimal.                           The examined duodenum was normal. Complications:            No immediate complications. Estimated Blood Loss:     Estimated blood loss was minimal. Impression:               - LA Grade A reflux esophagitis with no bleeding.                            Biopsied.                           - Erosive gastropathy with no bleeding and no                            stigmata of recent bleeding. Biopsied.                           - A few gastric polyps. Biopsied.                           - Normal examined duodenum. Recommendation:            - Patient has a contact number available for                            emergencies. The signs and symptoms of potential                            delayed complications were discussed with the                            patient. Return to normal activities tomorrow.                            Written discharge instructions were provided to the                            patient.                           -  Resume previous diet.                           - Continue present medications.                           - Increase omeprazole to 40 mg BID for 8 weeks.                           - Await pathology results.                           - No aspirin, ibuprofen, naproxen, or other                            non-steroidal anti-inflammatory drugs.                           - Office follow-up in 8-10 weeks, earlier if needed. Tressia Danas MD, MD 11/11/2022 11:39:37 AM This report has been signed electronically.

## 2022-11-11 NOTE — Progress Notes (Signed)
Called to room to assist during endoscopic procedure.  Patient ID and intended procedure confirmed with present staff. Received instructions for my participation in the procedure from the performing physician.  

## 2022-11-11 NOTE — Progress Notes (Signed)
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 Source of recent symptoms    - ? 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 Reflux versus functional dyspepsia Appropriate candidate for monitored anesthesia care  PLAN: EGD in the [place] today   HPI: Sheri Jensen is a 76 y.o. female presents for endoscopic evaluation.  She has continued to have intermittent nausea and vomiting similar to her symptoms resulting in [facility] visit 08/02/22, although not as severe, and not lasting for as long. She also had an episode of odynophagia when eating peanut butter.  When these episodes occur bowel rest, avoiding medications, and resting/relaxing result in improvement. She is concerned that she is eating too many small, sweet peppers and is hopeful that more evaluation is not needed. Symptoms despite simethicone.      Past Medical History:  Diagnosis Date   Depression    GERD (gastroesophageal reflux disease)    Hyperlipidemia    Hypertension     Past Surgical History:  Procedure Laterality Date   chidbirth     x2   COLONOSCOPY  03/00   2/10- normal     Current Outpatient Medications  Medication Sig Dispense Refill   acetaminophen (TYLENOL) 325 MG tablet Take 650 mg by mouth every 6 (six) hours as needed.     calcium-vitamin D (OSCAL) 250-125 MG-UNIT per tablet Take 1 tablet by mouth daily. Reported on 12/25/2015     diphenhydrAMINE (BENADRYL) 25 MG tablet Take 1 tablet by mouth as needed.     ibuprofen (ADVIL) 100 MG tablet Take 100 mg by mouth every 6 (six) hours as needed  for fever.     Multiple Vitamins-Minerals (WOMENS ONE DAILY PO) Take by mouth daily.     omeprazole (PRILOSEC) 40 MG capsule Take 1 capsule (40 mg total) by mouth daily. 90 capsule 3   sertraline (ZOLOFT) 100 MG tablet TAKE 1 TABLET BY MOUTH EVERY DAY IF TOLERATED 90 tablet 1   DM-Doxylamine-Acetaminophen (NYQUIL COLD & FLU PO) Take by mouth as needed.     Simethicone 125 MG CAPS Take by mouth as needed.     Current Facility-Administered Medications  Medication Dose Route Frequency Provider Last Rate Last Admin   0.9 %  sodium chloride infusion  500 mL Intravenous Once [place], MD        Allergies as of 11/11/2022   (No Known Allergies)    Family History  Problem Relation Age of Onset   Crohn's disease Daughter    Cancer Maternal Grandfather        colon   Colon cancer Maternal Grandfather    Diabetes Paternal Grandfather    Deafness Grandchild        congenital   Esophageal cancer Neg Hx    Pancreatic cancer Neg Hx    Stomach cancer Neg Hx      Physical Exam: General:   Alert,  well-nourished, pleasant and cooperative in NAD Head:  Normocephalic and atraumatic. Eyes:  Sclera clear, no icterus.   Conjunctiva pink. Mouth:  No deformity or lesions.  Neck:  Supple; no masses or thyromegaly. Lungs:  Clear throughout to auscultation.   No wheezes. Heart:  Regular rate and rhythm; no murmurs. Abdomen:  Soft, non-tender, nondistended, normal bowel sounds, no rebound or guarding.  Msk:  Symmetrical. No boney deformities LAD: No inguinal or umbilical LAD Extremities:  No clubbing or edema. Neurologic:  Alert and  oriented x4;  grossly nonfocal Skin:  No obvious rash or bruise. Psych:  Alert and cooperative. Normal mood and affect.     Studies/Results: No results found.    Meade Hogeland L. Orvan Falconer, MD, MPH 11/11/2022, 11:16 AM

## 2022-11-11 NOTE — Patient Instructions (Addendum)
Patient has a contact number available for emergencies. The signs and symptoms of potential delayed complications were discussed with the patient. Return to normal activities tomorrow. Written discharge instructions were provided to the patient. - Resume previous diet. - Continue present medications. - Increase omeprazole to 40 mg BID for 8 weeks. - Await pathology results. - No aspirin, ibuprofen, naproxen, or other non-steroidal anti-inflammatory drugs. - Office follow-up in 8-10 weeks, earlier if needed.  Recommendation: Tressia Danas  YOU HAD AN ENDOSCOPIC PROCEDURE TODAY: Refer to the procedure report and other information in the discharge instructions given to you for any specific questions about what was found during the examination. If this information does not answer your questions, please call [place] office at [phone] to clarify.   YOU SHOULD EXPECT: Some feelings of bloating in the abdomen. Passage of more gas than usual. Walking can help get rid of the air that was put into your GI tract during the procedure and reduce the bloating. If you had a lower endoscopy (such as a colonoscopy or flexible sigmoidoscopy) you may notice spotting of blood in your stool or on the toilet paper. Some abdominal soreness may be present for a day or two, also.  DIET: Your first meal following the procedure should be a light meal and then it is ok to progress to your normal diet. A half-sandwich or bowl of soup is an example of a good first meal. Heavy or fried foods are harder to digest and may make you feel nauseous or bloated. Drink plenty of fluids but you should avoid alcoholic beverages for 24 hours. If you had a esophageal dilation, please see attached instructions for diet.    ACTIVITY: Your care partner should take you home directly after the procedure. You should plan to take it easy, moving slowly for the rest of the day. You can resume normal activity the day after the procedure  however YOU SHOULD NOT DRIVE, use power tools, machinery or perform tasks that involve climbing or major physical exertion for 24 hours (because of the sedation medicines used during the test).   SYMPTOMS TO REPORT IMMEDIATELY: A gastroenterologist can be reached at any hour. Please call [phone]  for any of the following symptoms:  Following upper endoscopy (EGD, EUS, ERCP, esophageal dilation) Vomiting of blood or coffee ground material  New, significant abdominal pain  New, significant chest pain or pain under the shoulder blades  Painful or persistently difficult swallowing  New shortness of breath  Black, tarry-looking or red, bloody stools  FOLLOW UP:  If any biopsies were taken you will be contacted by phone or by letter within the next 1-3 weeks. Call [phone]  if you have not heard about the biopsies in 3 weeks.  Please also call with any specific questions about appointments or follow up tests.

## 2022-11-15 ENCOUNTER — Telehealth: Payer: Self-pay

## 2022-11-15 NOTE — Telephone Encounter (Signed)
Left message for patient to call back. Needs OV in 8-10 weeks with Dr. Tarri Glenn per procedure note.

## 2022-11-16 ENCOUNTER — Telehealth: Payer: Self-pay

## 2022-11-16 NOTE — Telephone Encounter (Signed)
  Follow up Call-     11/11/2022   10:21 AM  Call back number  Post procedure Call Back phone  # [phone]  Permission to leave phone message Yes     Left message

## 2022-11-16 NOTE — Telephone Encounter (Signed)
Left message for patient to call back  

## 2022-11-17 NOTE — Telephone Encounter (Signed)
Unable to reach patient x 3. Left message for patient to call back. Letter sent home.  

## 2022-12-01 DIAGNOSIS — Z780 Asymptomatic menopausal state: Secondary | ICD-10-CM | POA: Diagnosis not present

## 2022-12-01 DIAGNOSIS — M85852 Other specified disorders of bone density and structure, left thigh: Secondary | ICD-10-CM | POA: Diagnosis not present

## 2022-12-01 DIAGNOSIS — Z1231 Encounter for screening mammogram for malignant neoplasm of breast: Secondary | ICD-10-CM | POA: Diagnosis not present

## 2022-12-01 DIAGNOSIS — M85851 Other specified disorders of bone density and structure, right thigh: Secondary | ICD-10-CM | POA: Diagnosis not present

## 2022-12-01 LAB — HM DEXA SCAN

## 2022-12-01 LAB — HM MAMMOGRAPHY

## 2022-12-15 ENCOUNTER — Telehealth: Payer: Self-pay

## 2022-12-15 NOTE — Telephone Encounter (Signed)
Checking on results from pt's colonoscopy from 11/11/22.

## 2022-12-15 NOTE — Telephone Encounter (Signed)
Entered in error, pt had EGD and not colonoscopy.

## 2023-01-02 ENCOUNTER — Encounter: Payer: Self-pay | Admitting: Gastroenterology

## 2023-01-02 NOTE — Telephone Encounter (Signed)
Thanks for bringing this to my attention. I thought she had received the results by phone via the nursing team. But, I can't see that that happened. Letter created today. She has an upcoming office visit when we can review. Thanks.

## 2023-01-17 ENCOUNTER — Ambulatory Visit: Payer: Medicare Other | Admitting: Gastroenterology

## 2023-02-01 ENCOUNTER — Ambulatory Visit: Payer: Medicare Other | Admitting: Gastroenterology

## 2023-02-01 ENCOUNTER — Encounter: Payer: Self-pay | Admitting: Gastroenterology

## 2023-02-01 VITALS — BP 122/78 | HR 77 | Ht 63.0 in | Wt 114.0 lb

## 2023-02-01 DIAGNOSIS — R1310 Dysphagia, unspecified: Secondary | ICD-10-CM

## 2023-02-01 DIAGNOSIS — R112 Nausea with vomiting, unspecified: Secondary | ICD-10-CM | POA: Diagnosis not present

## 2023-02-01 DIAGNOSIS — R159 Full incontinence of feces: Secondary | ICD-10-CM

## 2023-02-01 NOTE — Patient Instructions (Addendum)
I recommend that you reduce your pantoprazole to every other day for the next week. If you have no further nausea or vomiting, stop the pantoprazole all together.  If you aren't feeling better off the pantoprazole, you can try famotidine 20 mg taken twice daily.  The next step would be to add Metamucil.  If the leakage continues, please stop in the lab for some stool studies.   Please send me a MyChart message with an update in 2-3 weeks.   _______________________________________________________  If your blood pressure at your visit was 140/90 or greater, please contact your primary care physician to follow up on this.  _______________________________________________________  If you are age 6 or older, your body mass index should be between 23-30. Your Body mass index is 20.19 kg/m. If this is out of the aforementioned range listed, please consider follow up with your Primary Care Provider.  If you are age 4 or younger, your body mass index should be between 19-25. Your Body mass index is 20.19 kg/m. If this is out of the aformentioned range listed, please consider follow up with your Primary Care Provider.   ________________________________________________________  The [facility] providers would like to encourage you to use [facility] to communicate with providers for non-urgent requests or questions.  Due to long hold times on the telephone, sending your provider a message by [facility] may be a faster and more efficient way to get a response.  Please allow 48 business hours for a response.  Please remember that this is for non-urgent requests.  _______________________________________________________

## 2023-02-01 NOTE — Progress Notes (Signed)
Referring Provider: Burnis Medin, MD Primary Care Physician:  Burnis Medin, MD  Chief complaint:  Nausea, vomiting, dysphagia, odynophagia   IMPRESSION:  Fecal incontinence developing over the last 2 months    - temporal association with symptom onset and starting pantoprazole    - interested in step-wise evaluation for treatment LA class a reflux esophagitis on EGD 11/11/2022 H. 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 Reflux versus functional dyspepsia Chronic constipation  Colon cancer screening    - Normal colonoscopy 2000 and  2010.    - Cologard negative 04/2020   PLAN: - Reduce pantoprazole to every other day - Stool for pancreatic elastase, GI pathogen panel and fecal calprotectin if symptoms do not improve - Consider famotidine 20 mg BID as an alternative to pantoprazole if needed for symptom control - Consider stool bulking if symptoms persist despite discontinuing pantoprazole - MyChart follow-up in 3-4 weeks - Office follow-up in 12-14 weeks, earlier if needed  Please see the "Patient Instructions" section for addition details about the plan.   HPI: Sheri Jensen is a 76 y.o. female returns in follow-up with recent concerns for nausea and vomiting. She seen in the office for these complaints 11/24/20 and 09/22/22. She had an EGD 11/11/22.  The interval history is obtained through the patient, her husband who accompanies her to this appointment, and review of her electronic health record.  Initial consult 07/29/20: Previously seen for  post-prandial eructation, that had become more bothersome over the last 2-3 years. It was occcuring immediately after eating, resolved within minutes and improved when sitting upright.  Some association with anxiety and frequent sighing. She does not chew gum, smoke, drink carbonated beverages, or eat quickly gulping foods and liquids. Embarrassed by the symptoms, although they did not prevent her from eating out or with other people. Wished to avoid endoscopy.     Celiac panel negative 01/2020 Ferritin, iron, and hemoglobin normal 01/2020  Abdominal ultrasound obtained for nausea, intractable vomiting, and eructation in 2017 was normal.   Office visit 11/24/20.: Returns in follow-up reporting a resolution in her symptoms.  Trial of Pepcid provided no significant relief. Headache with FDGard. Unable to find the pantoprazole for a PPI trial. Continues to wish to avoid endoscopy. Stopped drinking a glass of wine prior to dinner. Her husband even notices that she is better. Does better with wine during meal.   Urgent Care visit 08/02/22 for nausea, vomiting, dyphagia and abdominal pain. Labs showed normal lipase, liver enzymes, and CBC.  CT showed a small gastric fundal diverticulum, small umbilical hernia containing fat, but no source of symptoms.  She was treated with IV pantoprazole, ondansetron, and prochlorperazine.   Office visit 09/22/22: She has continued to have intermittent nausea and vomiting similar to her symptoms resulting in [facility] visit 08/02/22, although not as severe, and not lasting for as long. She also had an episode of odynophagia when eating peanut butter.  When these episodes occur bowel rest, avoiding medications, and resting/relaxing result  in improvement. She is concerned that she is eating too many small, sweet peppers and is hopeful that more evaluation is not needed. Symptoms despite simethicone.   EGD 11/11/2022: LA grade a reflux esophagitis, multiple gastric erosions, benign gastric  polyps, and normal duodenum.  Esophageal biopsies showed reflux.  Gastric biopsies showed reactive gastropathy with moderate chronic inactive gastritis.  There was no H. pylori.  The gastric body polyp showed no significant pathology.  Office visit 02/01/23: She has one month of intermittent fecal incontinence. New onset since her endoscopy and starting pantoprazole, although her nausea and vomiting have resolved. Will have several bowel movements daily with frequent fecal soiling of her underwear with formed stool. She wonders if her apple a day is causing. No nocturnal symptoms. No blood or mucous in the stool. Weight is stable. No urinary incontinence.   Colon cancer screening: Normal colonoscopy 2000 and  2010. Cologard negative 04/2020  Past Medical History:  Diagnosis Date   Depression    Hyperlipidemia    Hypertension     Past Surgical History:  Procedure Laterality Date   chidbirth     x2   COLONOSCOPY  03/00   2/10- normal     Current Outpatient Medications  Medication Sig Dispense Refill   acetaminophen (TYLENOL) 325 MG tablet Take 650 mg by mouth every 6 (six) hours as needed.     calcium-vitamin D (OSCAL) 250-125 MG-UNIT per tablet Take 1 tablet by mouth daily. Reported on 12/25/2015     diphenhydrAMINE (BENADRYL) 25 MG tablet Take 1 tablet by mouth as needed.     DM-Doxylamine-Acetaminophen (NYQUIL COLD & FLU PO) Take by mouth as needed.     ibuprofen (ADVIL) 100 MG tablet Take 100 mg by mouth every 6 (six) hours as needed for fever.     Multiple Vitamins-Minerals (WOMENS ONE DAILY PO) Take by mouth daily.     omeprazole (PRILOSEC) 40 MG capsule Take 1 capsule (40 mg total) by mouth daily. 90 capsule 3   sertraline (ZOLOFT) 100 MG tablet TAKE 1 TABLET BY MOUTH EVERY DAY IF TOLERATED 90 tablet 1   Simethicone 125 MG CAPS Take by mouth as needed.     No current facility-administered medications for this visit.    Allergies as of 09/22/2022   (No Known Allergies)     Family History  Problem Relation Age of Onset   Crohn's disease Daughter    Cancer Maternal Grandfather        colon   Colon cancer Maternal Grandfather    Diabetes Paternal Grandfather    Deafness Grandchild        congenital   Esophageal cancer Neg Hx    Pancreatic cancer Neg Hx    Stomach cancer Neg Hx        Physical Exam: General:   Alert,  well-nourished, pleasant and cooperative in NAD Head:  Normocephalic and atraumatic. Eyes:  Sclera clear, no icterus.   Conjunctiva pink. Abdomen:  Soft, nontender, nondistended, normal bowel sounds, no rebound or guarding. No hepatosplenomegaly.   Neurologic:  Alert and  oriented x4;  grossly nonfocal Skin:  Intact without significant lesions or rashes. Psych:  Alert and cooperative. Normal mood and affect.  I spent 33 minutes, including in depth chart review, independent review of results, communicating results with the patient directly, face-to-face time with the patient, coordinating care, and ordering studies and medications as appropriate, and documentation.   Vishnu Moeller L. Tarri Glenn, MD, MPH 09/22/2022, 8:34 PM

## 2023-02-25 ENCOUNTER — Other Ambulatory Visit: Payer: Self-pay | Admitting: Internal Medicine

## 2023-04-13 DIAGNOSIS — H5203 Hypermetropia, bilateral: Secondary | ICD-10-CM | POA: Diagnosis not present

## 2023-04-13 DIAGNOSIS — H524 Presbyopia: Secondary | ICD-10-CM | POA: Diagnosis not present

## 2023-04-19 ENCOUNTER — Ambulatory Visit (INDEPENDENT_AMBULATORY_CARE_PROVIDER_SITE_OTHER): Payer: Medicare Other | Admitting: Internal Medicine

## 2023-04-19 ENCOUNTER — Encounter: Payer: Self-pay | Admitting: Internal Medicine

## 2023-04-19 VITALS — BP 144/80 | HR 72 | Temp 97.8°F | Ht 63.0 in | Wt 113.2 lb

## 2023-04-19 DIAGNOSIS — R5383 Other fatigue: Secondary | ICD-10-CM | POA: Diagnosis not present

## 2023-04-19 DIAGNOSIS — E876 Hypokalemia: Secondary | ICD-10-CM

## 2023-04-19 DIAGNOSIS — E785 Hyperlipidemia, unspecified: Secondary | ICD-10-CM | POA: Diagnosis not present

## 2023-04-19 DIAGNOSIS — K2100 Gastro-esophageal reflux disease with esophagitis, without bleeding: Secondary | ICD-10-CM | POA: Diagnosis not present

## 2023-04-19 DIAGNOSIS — Z79899 Other long term (current) drug therapy: Secondary | ICD-10-CM

## 2023-04-19 NOTE — Patient Instructions (Signed)
Good to see  y ou today. 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

## 2023-04-19 NOTE — Progress Notes (Signed)
Chief Complaint  Patient presents with   Medication Consultation    HPI: Sheri Jensen 77 y.o. come in for Chronic disease management  Last pv 5 22 last visit 2023 Ed visit fall 23 for nv gi sx and fu Dr Orvan Falconer  last seen 3 24  see notes below  Egd esophagitis hx gi sx  change pantaprozole to   omeprazole  40 per day  and doing much better   Off sertraline for a month  doesn't think helped her fatigue or anhedonia  says less  interest but is social and interactive with friends   husbands parkinson's is progressing and he is "slow ".  Has more responsibilities  Grandchildren are growing so not as much contact but family and them "bring her joy" .   Has fallen off of  exercise  recently  was doing 7 minutes on elliptical   sleep is some better than in past  Hasn't checked bp recently but had been ok  ROS: See pertinent positives and negatives per HPI. No cp sob syncope  Past Medical History:  Diagnosis Date   Depression    GERD (gastroesophageal reflux disease)    Hyperlipidemia    Hypertension     Family History  Problem Relation Age of Onset   Crohn's disease Daughter    Cancer Maternal Grandfather        colon   Colon cancer Maternal Grandfather    Diabetes Paternal Grandfather    Deafness Grandchild        congenital   Esophageal cancer Neg Hx    Pancreatic cancer Neg Hx    Stomach cancer Neg Hx     Social History   Socioeconomic History   Marital status: Married    Spouse name: Not on file   Number of children: Not on file   Years of education: Not on file   Highest education level: Not on file  Occupational History   Not on file  Tobacco Use   Smoking status: Never   Smokeless tobacco: Never  Vaping Use   Vaping Use: Never used  Substance and Sexual Activity   Alcohol use: Yes    Comment: 2 glasses of wine daily    Drug use: No   Sexual activity: Not on file  Other Topics Concern   Not on file  Social History Narrative   No tobacco    hh of 2    A cat    etoh 2-3 wine  per day    With meals    Married one son and one daughter   Exercise well.    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     calcium-vitamin D (OSCAL) 250-125 MG-UNIT per tablet Take 1 tablet by mouth daily. Reported on 12/25/2015     diphenhydrAMINE (BENADRYL) 25 MG tablet Take 1 tablet by mouth as needed.     DM-Doxylamine-Acetaminophen (NYQUIL COLD & FLU PO) Take by mouth as needed.     ibuprofen (ADVIL) 100 MG tablet Take 100 mg by mouth every 6 (six) hours as  needed for fever.     Multiple Vitamins-Minerals (WOMENS ONE DAILY PO) Take by mouth daily.     omeprazole (PRILOSEC) 40 MG capsule Take 1 capsule (40 mg total) by mouth 2 (two) times daily. 120 capsule 0   Simethicone 125 MG CAPS Take by mouth as needed.     sertraline (ZOLOFT) 100 MG tablet TAKE 1 TABLET BY MOUTH EVERY DAY IF TOLERATED (Patient not taking: Reported on 04/19/2023) 90 tablet 1   No facility-administered medications prior to visit.     EXAM:  BP (!) 144/80 (BP Location: Left Arm, Patient Position: Sitting, Cuff Size: Normal)   Pulse 72   Temp 97.8 F (36.6 C) (Oral)   Ht 5\' 3"  (1.6 m)   Wt 113 lb 3.2 oz (51.3 kg)   SpO2 99%   BMI 20.05 kg/m   Body mass index is 20.05 kg/m.  GENERAL: vitals reviewed and listed above, alert, oriented, appears well hydrated and in no acute distress HEENT: atraumatic, conjunctiva  clear, no obvious abnormalities on inspection of external nose and ears tm clear  NECK: no obvious masses on inspection palpation  LUNGS: clear to auscultation bilaterally, no wheezes, rales or  rhonchi, good air movement CV: HRRR, no clubbing cyanosis or  peripheral edema nl cap refill  Abdomen:  Sof,t normal bowel sounds without hepatosplenomegaly, no guarding rebound or masses no CVA tenderness MS: moves all extremities without noticeable focal  abnormality PSYCH: pleasant and cooperative, no obvious depression or anxiety  Lab Results  Component Value Date   WBC 7.3 08/02/2022   HGB 14.6 08/02/2022   HCT 42.8 08/02/2022   PLT 304 08/02/2022   GLUCOSE 80 08/02/2022   CHOL 264 (H) 03/17/2021   TRIG 130.0 03/17/2021   HDL 77.50 03/17/2021   LDLDIRECT 109.9 06/11/2013   LDLCALC 160 (H) 03/17/2021   ALT 11 08/02/2022   AST 20 08/02/2022   NA 139 08/02/2022   K 3.3 (L) 08/02/2022   CL 103 08/02/2022   CREATININE 0.63 08/02/2022   BUN 19 08/02/2022   CO2 17 (L) 08/02/2022   TSH 1.22 03/17/2021   HGBA1C 5.4 01/14/2020   BP Readings from Last 3 Encounters:  04/19/23 (!) 144/80  02/01/23 122/78  11/11/22 (!) 157/78  Dexa -1.5  stable  Non fasting  but will proceed with lab ASSESSMENT AND PLAN:  Discussed the following assessment and plan:  Other fatigue - w anedonia  suspect mood no help siwth ssri and wellbutrin no hx of osa did better when around her grandkids that are now grown. update metabolic - Plan: Basic metabolic panel, CBC with Differential/Platelet, Hepatic function panel, Hemoglobin A1c, TSH, T4, free, Lipid panel  Gastroesophageal reflux disease with esophagitis without hemorrhage - on ppi and h2 blocker doing much better - Plan: Basic metabolic panel, CBC with Differential/Platelet, Hepatic function panel, Hemoglobin A1c, TSH, T4, free, Lipid panel  Low blood potassium - Plan: Basic metabolic panel, CBC with Differential/Platelet, Hepatic function panel, Hemoglobin A1c, TSH, T4, free, Lipid panel  Hyperlipidemia, unspecified hyperlipidemia type - Plan: Basic metabolic panel, CBC with Differential/Platelet, Hepatic function panel, Hemoglobin A1c, TSH, T4,  free, Lipid panel  Medication management Not sure what med would help her ssri and Wellbutrin   but if med would herlp her have more energy would take  Recheck  metabolic  due. Ok to stay off sertraline . Since off for a month and doesn't think helped that much and had a trila of wellbutrin in past  Will attent to  use exercise program as " medicine "   place on reg schedule and then fo to see if hepful  No obv cv pulm cause at this time  Husbands parkinson's  is causing more and more   obligations on her.  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 Will also fu bp readings  Patient Instructions  Good to see  y ou today. 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   [name]. Conny Moening M.D. Note from dr Orvan Falconer IMPRESSION:  Fecal incontinence developing over the last 2 months    - temporal association with symptom onset and starting pantoprazole    - interested in step-wise evaluation for treatment LA class a reflux esophagitis on EGD 11/11/2022 H. 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 Reflux versus functional dyspepsia Chronic constipation  Colon cancer screening    - Normal colonoscopy 2000 and  2010.    - Cologard negative 04/2020     PLAN: - Reduce pantoprazole to every other day - Stool for pancreatic elastase, GI pathogen panel and fecal calprotectin if symptoms do not  improve - Consider famotidine 20 mg BID as an alternative to pantoprazole if needed for symptom control - Consider stool bulking if symptoms persist despite discontinuing pantoprazole - MyChart follow-up in 3-4 weeks - Office follow-up in 12-14 weeks, earlier if needed

## 2023-04-20 LAB — CBC WITH DIFFERENTIAL/PLATELET
Basophils Absolute: 0 10*3/uL (ref 0.0–0.1)
Basophils Relative: 0.7 % (ref 0.0–3.0)
Eosinophils Absolute: 0.2 10*3/uL (ref 0.0–0.7)
Eosinophils Relative: 2.8 % (ref 0.0–5.0)
HCT: 38 % (ref 36.0–46.0)
Hemoglobin: 12.5 g/dL (ref 12.0–15.0)
Lymphocytes Relative: 31.4 % (ref 12.0–46.0)
Lymphs Abs: 2 10*3/uL (ref 0.7–4.0)
MCHC: 32.9 g/dL (ref 30.0–36.0)
MCV: 96.9 fl (ref 78.0–100.0)
Monocytes Absolute: 0.5 10*3/uL (ref 0.1–1.0)
Monocytes Relative: 7.9 % (ref 3.0–12.0)
Neutro Abs: 3.6 10*3/uL (ref 1.4–7.7)
Neutrophils Relative %: 57.2 % (ref 43.0–77.0)
Platelets: 281 10*3/uL (ref 150.0–400.0)
RBC: 3.92 Mil/uL (ref 3.87–5.11)
RDW: 13.8 % (ref 11.5–15.5)
WBC: 6.3 10*3/uL (ref 4.0–10.5)

## 2023-04-20 LAB — BASIC METABOLIC PANEL
BUN: 14 mg/dL (ref 6–23)
CO2: 28 mEq/L (ref 19–32)
Calcium: 9.8 mg/dL (ref 8.4–10.5)
Chloride: 104 mEq/L (ref 96–112)
Creatinine, Ser: 0.76 mg/dL (ref 0.40–1.20)
GFR: 76.73 mL/min (ref >60.00)
Glucose, Bld: 98 mg/dL (ref 70–99)
Potassium: 4.5 mEq/L (ref 3.5–5.1)
Sodium: 141 mEq/L (ref 135–145)

## 2023-04-20 LAB — HEPATIC FUNCTION PANEL
ALT: 10 U/L (ref 0–35)
AST: 18 U/L (ref 0–37)
Albumin: 4.5 g/dL (ref 3.5–5.2)
Alkaline Phosphatase: 57 U/L (ref 39–117)
Bilirubin, Direct: 0.1 mg/dL (ref 0.0–0.3)
Total Bilirubin: 0.3 mg/dL (ref 0.2–1.2)
Total Protein: 7.6 g/dL (ref 6.0–8.3)

## 2023-04-20 LAB — T4, FREE: Free T4: 0.8 ng/dL (ref 0.60–1.60)

## 2023-04-20 LAB — HEMOGLOBIN A1C: Hgb A1c MFr Bld: 5.6 % (ref 4.6–6.5)

## 2023-04-20 LAB — TSH: TSH: 1.5 u[IU]/mL (ref 0.35–5.50)

## 2023-04-20 LAB — LIPID PANEL
Cholesterol: 233 mg/dL — ABNORMAL HIGH (ref 0–200)
HDL: 74.6 mg/dL (ref >39.00)
LDL Cholesterol: 124 mg/dL — ABNORMAL HIGH (ref 0–99)
NonHDL: 158.8
Total CHOL/HDL Ratio: 3
Triglycerides: 176 mg/dL — ABNORMAL HIGH (ref 0.0–149.0)
VLDL: 35.2 mg/dL (ref 0.0–40.0)

## 2023-05-08 NOTE — Progress Notes (Signed)
Blood results are in range except cholesterol slightly up (as in past ) but  much better than last year

## 2023-05-11 ENCOUNTER — Encounter: Payer: Self-pay | Admitting: Internal Medicine

## 2023-05-11 ENCOUNTER — Ambulatory Visit: Payer: Medicare Other | Admitting: Internal Medicine

## 2023-05-11 VITALS — BP 118/70 | HR 84 | Ht 63.0 in | Wt 112.0 lb

## 2023-05-11 DIAGNOSIS — K219 Gastro-esophageal reflux disease without esophagitis: Secondary | ICD-10-CM

## 2023-05-11 DIAGNOSIS — Z1211 Encounter for screening for malignant neoplasm of colon: Secondary | ICD-10-CM

## 2023-05-11 MED ORDER — FAMOTIDINE 20 MG PO TABS: 20.0000 mg | ORAL_TABLET | Freq: Two times a day (BID) | ORAL | 11 refills | Status: DC | PRN

## 2023-05-11 NOTE — Patient Instructions (Addendum)
We have sent the following medications to your pharmacy for you to pick up at your convenience: Famotidine 20 mg 2 times daily or as needed   Your provider has ordered Cologuard testing as an option for colon cancer screening. This is performed by [facility] and may be out of network with your insurance. PRIOR to completing the test, it is YOUR responsibility to contact your insurance about covered benefits for this test. Your out of pocket expense could be anywhere from $0.00 to $649.00.   When you call to check coverage with your insurer, please provide the following information:   -The ONLY provider of Cologuard is Optician, dispensing  - CPT code for Cologuard is [phone].  [facility] NPI # [id number]  -[employer] Tax ID # [id number]   We have already sent your demographic and insurance information to [facility] (phone number [phone]) and they should contact you within the next week regarding your test. If you have not heard from them within the next week, please call our office at [phone].   Follow up in 1 year  If your blood pressure at your visit was 140/90 or greater, please contact your primary care physician to follow up on this.  _______________________________________________________  If you are age 45 or older, your body mass index should be between 23-30. Your Body mass index is 19.84 kg/m. If this is out of the aforementioned range listed, please consider follow up with your Primary Care Provider.  If you are age 1 or younger, your body mass index should be between 19-25. Your Body mass index is 19.84 kg/m. If this is out of the aformentioned range listed, please consider follow up with your Primary Care Provider.   ________________________________________________________  The [facility] providers would like to encourage you to use [facility] to communicate with providers for non-urgent requests or questions.  Due to  long hold times on the telephone, sending your provider a message by [facility] may be a faster and more efficient way to get a response.  Please allow 48 business hours for a response.  Please remember that this is for non-urgent requests.  _______________________________________________________   Due to recent changes in healthcare laws, you may see the results of your imaging and laboratory studies on MyChart before your provider has had a chance to review them.  We understand that in some cases there may be results that are confusing or concerning to you. Not all laboratory results come back in the same time frame and the provider may be waiting for multiple results in order to interpret others.  Please give [place] 48 hours in order for your provider to thoroughly review all the results before contacting the office for clarification of your results.    Thank you for entrusting me with your care and for choosing [facility], Dr. Eulah Pont

## 2023-05-11 NOTE — Progress Notes (Signed)
Referring Provider: Madelin Headings, MD Primary Care Physician:  Madelin Headings, MD  Chief complaint:  Follow up of fecal incontinence   IMPRESSION:  LA class a reflux esophagitis on EGD 11/11/2022 H. 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 Reflux versus functional dyspepsia Chronic constipation  Colon cancer screening    - Normal colonoscopy 2000 and  2010.    - Cologard negative 04/2020 Prior fecal incontinence - resolved after stopping pantoprazole  PLAN: - GERD handout - Start famotidine 20 mg BID PRN - Cologuard today - RTC 1 year   HPI: Sheri Jensen is a 77 y.o. female returns in follow-up of fecal incontinence  Initial consult 07/29/20: Previously seen for post-prandial eructation, that had become more bothersome over the last 2-3 years. It was occcuring immediately after eating, resolved within minutes and improved when sitting upright.  Some association with anxiety and frequent sighing. She does not chew gum, smoke, drink carbonated beverages, or eat quickly gulping foods and liquids. Embarrassed by the symptoms, although they did not prevent her from eating out or with other people. Wished to avoid endoscopy.     Celiac panel negative 01/2020 Ferritin, iron, and hemoglobin normal 01/2020  Abdominal ultrasound obtained for nausea, intractable vomiting, and eructation in 2017 was normal.   Office visit 11/24/20.: Returns in follow-up reporting a resolution in her symptoms.  Trial of Pepcid provided no significant relief. Headache with FDGard. Unable to find the pantoprazole for a PPI trial. Continues to wish to avoid endoscopy. Stopped drinking a glass of wine prior to dinner. Her husband even notices that she is better. Does better with wine during  meal.   Urgent Care visit 08/02/22 for nausea, vomiting, dyphagia and abdominal pain. Labs showed normal lipase, liver enzymes, and CBC.  CT showed a small gastric fundal diverticulum, small umbilical hernia containing fat, but no source of symptoms.  She was treated with IV pantoprazole, ondansetron, and prochlorperazine.   Office visit 09/22/22: She has continued to have intermittent nausea and vomiting similar to her symptoms resulting in [facility] visit 08/02/22, although not as severe, and not lasting for as long. She also had an episode of odynophagia when eating peanut butter.  When these episodes occur bowel rest, avoiding medications, and resting/relaxing result in improvement. She is concerned that she is eating too many small, sweet peppers and is hopeful that more evaluation is not needed. Symptoms despite simethicone.   EGD 11/11/2022: LA grade a reflux esophagitis, multiple gastric erosions, benign gastric polyps, and normal duodenum.  Esophageal biopsies showed reflux.  Gastric biopsies showed reactive gastropathy with moderate chronic inactive gastritis.  There was no H. pylori.  The gastric body polyp showed no significant pathology.  Office visit 02/01/23: She has one month of intermittent fecal incontinence. New onset since her endoscopy and starting pantoprazole, although her nausea and vomiting have resolved. Will have several bowel movements daily with frequent fecal soiling of her underwear with formed stool. She wonders if her apple a day is causing. No nocturnal symptoms. No blood or mucous in the stool. Weight is stable. No urinary incontinence.   Interval History: Fecal incontinence has resolved with stopping of pantoprazole. Bowel habits are regular bowel habits. Her acid reflux is well controlled. At times she  has burping and hiccups. She doesn't drink carbonate beverages. She doesn't eat quickly. Denies rectal bleeding.  Wt Readings from Last 3 Encounters:  05/11/23 112 lb (50.8 kg)   04/19/23 113 lb 3.2 oz (51.3 kg)  02/01/23 114 lb (51.7 kg)   Colon cancer screening: Normal colonoscopy 2000 and  2010. Cologard negative 04/2020  Past Medical History:  Diagnosis Date   Depression    GERD (gastroesophageal reflux disease)    Hyperlipidemia    Hypertension     Past Surgical History:  Procedure Laterality Date   chidbirth     x2   COLONOSCOPY  03/00   2/10- normal     Current Outpatient Medications  Medication Sig Dispense Refill   acetaminophen (TYLENOL) 325 MG tablet Take 650 mg by mouth every 6 (six) hours as needed.     calcium-vitamin D (OSCAL) 250-125 MG-UNIT per tablet Take 1 tablet by mouth daily. Reported on 12/25/2015     diphenhydrAMINE (BENADRYL) 25 MG tablet Take 1 tablet by mouth as needed.     DM-Doxylamine-Acetaminophen (NYQUIL COLD & FLU PO) Take by mouth as needed.     ibuprofen (ADVIL) 100 MG tablet Take 100 mg by mouth every 6 (six) hours as needed for fever.     Multiple Vitamins-Minerals (WOMENS ONE DAILY PO) Take by mouth daily.     omeprazole (PRILOSEC) 40 MG capsule Take 1 capsule (40 mg total) by mouth 2 (two) times daily. 120 capsule 0   Simethicone 125 MG CAPS Take by mouth as needed.     No current facility-administered medications for this visit.    Allergies as of 05/11/2023   (No Known Allergies)    Family History  Problem Relation Age of Onset   Crohn's disease Daughter    Cancer Maternal Grandfather        colon   Colon cancer Maternal Grandfather    Diabetes Paternal Grandfather    Deafness Grandchild        congenital   Esophageal cancer Neg Hx    Pancreatic cancer Neg Hx    Stomach cancer Neg Hx     Physical Exam: Vitals:   05/11/23 1502  BP: 118/70  Pulse: 84    General:   Alert,  well-nourished, pleasant and cooperative in NAD Head:  Normocephalic and atraumatic. Eyes:  Sclera clear, no icterus.   Conjunctiva pink. Abdomen:  Soft, nontender, nondistended, normal bowel sounds, no rebound or  guarding. No hepatosplenomegaly.   Neurologic:  Alert and  oriented x4;  grossly nonfocal Skin:  Intact without significant lesions or rashes. Psych:  Alert and cooperative. Normal mood and affect.  I spent 32 minutes, including in depth chart review, independent review of results, communicating results with the patient directly, face-to-face time with the patient, coordinating care, and ordering studies and medications as appropriate, and documentation.   Eulah Pont, MD 05/11/2023, 3:05 PM

## 2023-05-17 ENCOUNTER — Encounter: Payer: Self-pay | Admitting: Internal Medicine

## 2023-05-17 ENCOUNTER — Ambulatory Visit (INDEPENDENT_AMBULATORY_CARE_PROVIDER_SITE_OTHER): Payer: Medicare Other | Admitting: Internal Medicine

## 2023-05-17 VITALS — BP 122/68 | HR 70 | Temp 98.0°F | Ht 63.0 in | Wt 112.6 lb

## 2023-05-17 DIAGNOSIS — E785 Hyperlipidemia, unspecified: Secondary | ICD-10-CM

## 2023-05-17 DIAGNOSIS — Z79899 Other long term (current) drug therapy: Secondary | ICD-10-CM | POA: Diagnosis not present

## 2023-05-17 DIAGNOSIS — R5383 Other fatigue: Secondary | ICD-10-CM

## 2023-05-17 NOTE — Progress Notes (Signed)
Chief Complaint  Patient presents with   Medical Management of Chronic Issues    HPI: Sheri Jensen 77 y.o. come in for fu  for fatigue [place] Has seen gi team   egd  relux without  Had fecal incontinence with ppi protonix   so using famotidine  ROS: See pertinent positives and negatives per HPI.  Past Medical History:  Diagnosis Date   Depression    GERD (gastroesophageal reflux disease)    Hyperlipidemia    Hypertension     Family History  Problem Relation Age of Onset   Crohn's disease Daughter    Cancer Maternal Grandfather        colon   Colon cancer Maternal Grandfather    Diabetes Paternal Grandfather    Deafness Grandchild        congenital   Esophageal cancer Neg Hx    Pancreatic cancer Neg Hx    Stomach cancer Neg Hx     Social History   Socioeconomic History   Marital status: Married    Spouse name: Not on file   Number of children: Not on file   Years of education: Not on file   Highest education level: Not on file  Occupational History   Not on file  Tobacco Use   Smoking status: Never   Smokeless tobacco: Never  Vaping Use   Vaping Use: Never used  Substance and Sexual Activity   Alcohol use: Yes    Comment: 2 glasses of wine daily    Drug use: No   Sexual activity: Not on file  Other Topics Concern   Not on file  Social History Narrative   No tobacco    hh of 2   A cat    etoh 2-3 wine  per day    With meals    Married one son and one daughter   Exercise well.    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     calcium-vitamin D (OSCAL) 250-125 MG-UNIT  per tablet Take 1 tablet by mouth daily. Reported on 12/25/2015     diphenhydrAMINE (BENADRYL) 25 MG tablet Take 1 tablet by mouth as needed.     DM-Doxylamine-Acetaminophen (NYQUIL COLD & FLU PO) Take by mouth as needed.     ibuprofen (ADVIL) 100 MG tablet Take 100 mg by mouth every 6 (six) hours as needed for fever.     Multiple Vitamins-Minerals (WOMENS ONE DAILY PO) Take by mouth daily.     Simethicone 125 MG CAPS Take by mouth as needed.     famotidine (PEPCID) 20 MG tablet Take 1 tablet (20 mg total) by mouth 2 (two) times daily as needed for heartburn or indigestion. 60 tablet 11   omeprazole (PRILOSEC) 40 MG capsule Take 1 capsule (40 mg total) by mouth 2 (two) times daily. 120 capsule 0   No facility-administered medications prior to visit.     EXAM:  BP 122/68 (BP Location: Right Arm, Patient Position: Sitting, Cuff Size: Normal)   Pulse 70   Temp 98 F (36.7 C) (Oral)  Ht 5\' 3"  (1.6 m)   Wt 112 lb 9.6 oz (51.1 kg)   SpO2 98%   BMI 19.95 kg/m   Body mass index is 19.95 kg/m.  GENERAL: vitals reviewed and listed above, alert, oriented, appears well hydrated and in no acute distress HEENT: atraumatic, conjunctiva  clear, no obvious abnormalities on inspection of external nose and ears NECK: no obvious masses on inspection palpation  MS: moves all extremities without noticeable focal  abnormality PSYCH: pleasant and cooperative,  Lab Results  Component Value Date   WBC 6.3 04/19/2023   HGB 12.5 04/19/2023   HCT 38.0 04/19/2023   PLT 281.0 04/19/2023   GLUCOSE 98 04/19/2023   CHOL 233 (H) 04/19/2023   TRIG 176.0 (H) 04/19/2023   HDL 74.60 04/19/2023   LDLDIRECT 109.9 06/11/2013   LDLCALC 124 (H) 04/19/2023   ALT 10 04/19/2023   AST 18 04/19/2023   NA 141 04/19/2023   K 4.5 04/19/2023   CL 104 04/19/2023   CREATININE 0.76 04/19/2023   BUN 14 04/19/2023   CO2 28 04/19/2023   TSH 1.50 04/19/2023   HGBA1C 5.6 04/19/2023   BP Readings from Last 3 Encounters:   05/17/23 122/68  05/11/23 118/70  04/19/23 (!) 144/80  The 10-year ASCVD risk score (Arnett DK, et al., 2019) is: 15%   Values used to calculate the score:     Age: 8 years     Sex: Female     Is Non-Hispanic African American: No     Diabetic: No     Tobacco smoker: No     Systolic Blood Pressure: 122 mmHg     Is BP treated: No     HDL Cholesterol: 74.6 mg/dL     Total Cholesterol: 233 mg/dL Ct last year for gi  mild cortical thinning ? ? Aortic athero  ASSESSMENT AND PLAN:  Discussed the following assessment and plan:  Other fatigue - has optimized  some ls  she feels some is depressioon like is taking action to volunteer with children form completed.  Hyperlipidemia, unspecified hyperlipidemia type  Medication management - at this time not sure if oyster calcium is a pure product   . will consider Glad the gi sx are getting better  Sleep is better since dec etoh but still tired all te time  husband says some snoring  but   he snores also  She thinks its a form of depression Disc  getting counselor  ( counseling in past was related to marriage a while back but not related to her situation )  her husband has parkinson's  some memory issues .  forms completed about  volunteer at child care.  Plan fu 2 -3 mos to see how doing .  -Patient advised to return or notify health care team  if  new concerns arise.  Patient Instructions  Good to see you.  Today   Suggest counselor as discussed [phone] is [facility]  number.   Consider ct calcium score .   To see if plaque build up in heart arteries  and may benefit from statin cholesterol lowering medication.  We can order at any time 99 $ self pay. Good luck with  volunteering  with the children.  Plan fu in 3 months to see how doing   [name]. Lindy Garczynski M.D.

## 2023-05-17 NOTE — Patient Instructions (Signed)
Good to see you.  Today   Suggest counselor as discussed [phone] is [facility]  number.   Consider ct calcium score .   To see if plaque build up in heart arteries  and may benefit from statin cholesterol lowering medication.  We can order at any time 99 $ self pay. Good luck with  volunteering  with the children.  Plan fu in 3 months to see how doing

## 2023-06-27 DIAGNOSIS — Z1211 Encounter for screening for malignant neoplasm of colon: Secondary | ICD-10-CM | POA: Diagnosis not present

## 2023-07-03 NOTE — Progress Notes (Signed)
Hi Tisha, please let the patient know that her Cologuard test was positive. I would recommend that she be scheduled for a colonoscopy in [facility] so please see if she is interested in getting this scheduled.

## 2023-08-24 ENCOUNTER — Encounter: Payer: Self-pay | Admitting: Internal Medicine

## 2023-08-24 ENCOUNTER — Ambulatory Visit: Payer: Medicare Other

## 2023-08-24 VITALS — Ht 63.0 in | Wt 115.0 lb

## 2023-08-24 DIAGNOSIS — Z85038 Personal history of other malignant neoplasm of large intestine: Secondary | ICD-10-CM

## 2023-08-24 DIAGNOSIS — Z1211 Encounter for screening for malignant neoplasm of colon: Secondary | ICD-10-CM

## 2023-08-24 MED ORDER — NA SULFATE-K SULFATE-MG SULF 17.5-3.13-1.6 GM/177ML PO SOLN
1.0000 | Freq: Once | ORAL | 0 refills | Status: AC
Start: 2023-08-24 — End: 2023-08-24

## 2023-08-24 NOTE — Progress Notes (Signed)
No egg or soy allergy known to patient  No issues known to pt with past sedation with any surgeries or procedures Patient denies ever being told they had issues or difficulty with intubation  No FH of Malignant Hyperthermia Pt is not on diet pills Pt is not on  home 02  Pt is not on blood thinners  Pt has some issues with constipation takes stool softener No A fib or A flutter Have any cardiac testing pending--no Pt instructed to use [email or web address] or GoodRx for a price reduction on prep

## 2023-09-04 ENCOUNTER — Ambulatory Visit: Payer: Medicare Other | Admitting: Internal Medicine

## 2023-09-04 ENCOUNTER — Encounter: Payer: Self-pay | Admitting: Internal Medicine

## 2023-09-04 VITALS — BP 118/73 | HR 57 | Temp 98.4°F | Resp 12 | Ht 63.0 in | Wt 115.0 lb

## 2023-09-04 DIAGNOSIS — Z800 Family history of malignant neoplasm of digestive organs: Secondary | ICD-10-CM

## 2023-09-04 DIAGNOSIS — D123 Benign neoplasm of transverse colon: Secondary | ICD-10-CM

## 2023-09-04 DIAGNOSIS — K635 Polyp of colon: Secondary | ICD-10-CM | POA: Diagnosis not present

## 2023-09-04 DIAGNOSIS — Z1211 Encounter for screening for malignant neoplasm of colon: Secondary | ICD-10-CM

## 2023-09-04 DIAGNOSIS — R195 Other fecal abnormalities: Secondary | ICD-10-CM

## 2023-09-04 DIAGNOSIS — D120 Benign neoplasm of cecum: Secondary | ICD-10-CM

## 2023-09-04 MED ORDER — SODIUM CHLORIDE 0.9 % IV SOLN: 500.0000 mL | Freq: Once | INTRAVENOUS | Status: DC

## 2023-09-04 NOTE — Progress Notes (Signed)
GASTROENTEROLOGY PROCEDURE H&P NOTE   Primary Care Physician: Madelin Headings, MD    Reason for Procedure:   Positive Cologuard  Plan:    Colonoscopy  Patient is appropriate for endoscopic procedure(s) in the ambulatory ([facility]) setting.  The nature of the procedure, as well as the risks, benefits, and alternatives were carefully and thoroughly reviewed with the patient. Ample time for discussion and questions allowed. The patient understood, was satisfied, and agreed to proceed.     HPI: Sheri Jensen is a 77 y.o. female who presents for colonoscopy for evaluation of positive Cologuard .  Patient was most recently seen in the Gastroenterology Clinic on 05/11/23.  No interval change in medical history since that appointment. Please refer to that note for full details regarding GI history and clinical presentation.   Past Medical History:  Diagnosis Date   Anxiety    Depression    GERD (gastroesophageal reflux disease)    Hyperlipidemia     Past Surgical History:  Procedure Laterality Date   chidbirth     x2   COLONOSCOPY  03/00   2/10- normal     Prior to Admission medications   Medication Sig Start Date End Date Taking? Authorizing Provider  acetaminophen (TYLENOL) 325 MG tablet Take 650 mg by mouth every 6 (six) hours as needed.   Yes [provider]  calcium-vitamin D (OSCAL) 250-125 MG-UNIT per tablet Take 1 tablet by mouth daily. Reported on 12/25/2015   Yes [provider]  diphenhydrAMINE (BENADRYL) 25 MG tablet Take 1 tablet by mouth as needed.   Yes [provider]  DM-Doxylamine-Acetaminophen (NYQUIL COLD & FLU PO) Take by mouth as needed.   Yes [provider]  famotidine (PEPCID) 20 MG tablet Take 20 mg by mouth 2 (two) times daily. 05/28/23  Yes [provider]  ibuprofen (ADVIL) 100 MG tablet Take 100 mg by mouth every 6 (six) hours as needed for fever.   Yes [provider]  Multiple Vitamins-Minerals  (WOMENS ONE DAILY PO) Take by mouth daily.   Yes [provider]  omeprazole (PRILOSEC) 40 MG capsule Take 40 mg by mouth daily.   Yes [provider]  Simethicone 125 MG CAPS Take by mouth as needed.   Yes [provider]    Current Outpatient Medications  Medication Sig Dispense Refill   acetaminophen (TYLENOL) 325 MG tablet Take 650 mg by mouth every 6 (six) hours as needed.     calcium-vitamin D (OSCAL) 250-125 MG-UNIT per tablet Take 1 tablet by mouth daily. Reported on 12/25/2015     diphenhydrAMINE (BENADRYL) 25 MG tablet Take 1 tablet by mouth as needed.     DM-Doxylamine-Acetaminophen (NYQUIL COLD & FLU PO) Take by mouth as needed.     famotidine (PEPCID) 20 MG tablet Take 20 mg by mouth 2 (two) times daily.     ibuprofen (ADVIL) 100 MG tablet Take 100 mg by mouth every 6 (six) hours as needed for fever.     Multiple Vitamins-Minerals (WOMENS ONE DAILY PO) Take by mouth daily.     omeprazole (PRILOSEC) 40 MG capsule Take 40 mg by mouth daily.     Simethicone 125 MG CAPS Take by mouth as needed.     Current Facility-Administered Medications  Medication Dose Route Frequency Provider Last Rate Last Admin   0.9 %  sodium chloride infusion  500 mL Intravenous Once Imogene Burn, MD        Allergies as of 09/04/2023   (  No Known Allergies)    Family History  Problem Relation Age of Onset   Colon polyps Mother    Cancer Maternal Grandfather        colon   Colon cancer Maternal Grandfather    Diabetes Paternal Grandfather    Crohn's disease Daughter    Deafness Grandchild        congenital   Esophageal cancer Neg Hx    Pancreatic cancer Neg Hx    Stomach cancer Neg Hx    Rectal cancer Neg Hx     Social History   Socioeconomic History   Marital status: Married    Spouse name: Not on file   Number of children: Not on file   Years of education: Not on file   Highest education level: Not on file  Occupational History   Not on file  Tobacco  Use   Smoking status: Never   Smokeless tobacco: Never  Vaping Use   Vaping status: Never Used  Substance and Sexual Activity   Alcohol use: Yes    Comment: 3 glasses a week   Drug use: No   Sexual activity: Not on file  Other Topics Concern   Not on file  Social History Narrative   No tobacco    hh of 2   A cat    etoh 2-3 wine  per day    With meals    Married one son and one daughter   Exercise well.    Occupation:  Former Teacher, music Strain: Not on [employer] Insecurity: Not on file  Transportation Needs: Not on file  Physical Activity: Not on file  Stress: Not on file  Social Connections: Not on file  Intimate Partner Violence: Not on file    Physical Exam: Vital signs in last 24 hours: BP (!) 135/58   Pulse 60   Temp 98.4 F (36.9 C)   Ht 5\' 3"  (1.6 m)   Wt 115 lb (52.2 kg)   SpO2 99%   BMI 20.37 kg/m  GEN: NAD EYE: Sclerae anicteric ENT: MMM CV: Non-tachycardic Pulm: No increased WOB GI: Soft NEURO:  Alert & Oriented   Eulah Pont, MD [facility]   09/04/2023 2:31 PM

## 2023-09-04 NOTE — Patient Instructions (Addendum)
   Handouts on polyps,diverticulosis,& hemorrhoids given to you today.   Await pathology results on polyps removed   Continue previous diet & medications    YOU HAD AN ENDOSCOPIC PROCEDURE TODAY AT THE [facility]:   Refer to the procedure report that was given to you for any specific questions about what was found during the examination.  If the procedure report does not answer your questions, please call your gastroenterologist to clarify.  If you requested that your care partner not be given the details of your procedure findings, then the procedure report has been included in a sealed envelope for you to review at your convenience later.  YOU SHOULD EXPECT: Some feelings of bloating in the abdomen. Passage of more gas than usual.  Walking can help get rid of the air that was put into your GI tract during the procedure and reduce the bloating. If you had a lower endoscopy (such as a colonoscopy or flexible sigmoidoscopy) you may notice spotting of blood in your stool or on the toilet paper. If you underwent a bowel prep for your procedure, you may not have a normal bowel movement for a few days.  Please Note:  You might notice some irritation and congestion in your nose or some drainage.  This is from the oxygen used during your procedure.  There is no need for concern and it should clear up in a day or so.  SYMPTOMS TO REPORT IMMEDIATELY:  Following lower endoscopy (colonoscopy or flexible sigmoidoscopy):  Excessive amounts of blood in the stool  Significant tenderness or worsening of abdominal pains  Swelling of the abdomen that is new, acute  Fever of 100F or higher   For urgent or emergent issues, a gastroenterologist can be reached at any hour by calling [phone]. Do not use MyChart messaging for urgent concerns.    DIET:  We do recommend a small meal at first, but then you may proceed to your regular diet.  Drink plenty of fluids but you should avoid alcoholic  beverages for 24 hours.  ACTIVITY:  You should plan to take it easy for the rest of today and you should NOT DRIVE or use heavy machinery until tomorrow (because of the sedation medicines used during the test).    FOLLOW UP: Our staff will call the number listed on your records the next business day following your procedure.  We will call around 7:15- 8:00 am to check on you and address any questions or concerns that you may have regarding the information given to you following your procedure. If we do not reach you, we will leave a message.     If any biopsies were taken you will be contacted by phone or by letter within the next 1-3 weeks.  Please call us at [phone] if you have not heard about the biopsies in 3 weeks.    SIGNATURES/CONFIDENTIALITY: You and/or your care partner have signed paperwork which will be entered into your electronic medical record.  These signatures attest to the fact that that the information above on your After Visit Summary has been reviewed and is understood.  Full responsibility of the confidentiality of this discharge information lies with you and/or your care-partner.

## 2023-09-04 NOTE — Progress Notes (Signed)
Patient having 4-5/10 abdominal cramp discomfort prior to procedure. MD made aware.  Jaw thrust performed for a brief time. Uneventful anesthetic. Report to pacu rn. Vss. Care resumed by rn.

## 2023-09-04 NOTE — Progress Notes (Signed)
Called to room to assist during endoscopic procedure.  Patient ID and intended procedure confirmed with present staff. Received instructions for my participation in the procedure from the performing physician.  

## 2023-09-04 NOTE — Progress Notes (Signed)
VS by DT  Pt's states no medical or surgical changes since previsit or office visit.  

## 2023-09-04 NOTE — Op Note (Signed)
[facility] Patient Name: Sheri Jensen Procedure Date: 09/04/2023 2:31 PM MRN: [id number] Endoscopist: Madelyn Brunner Redwood , , [id number] Age: 77 Referring MD:  Date of Birth: 12/08/1945 Gender: Female Account #: [id number] Procedure:                Colonoscopy Indications:              Positive Cologuard test Medicines:                Monitored Anesthesia Care Procedure:                Pre-Anesthesia Assessment:                           - Prior to the procedure, a History and Physical                            was performed, and patient medications and                            allergies were reviewed. The patient's tolerance of                            previous anesthesia was also reviewed. The risks                            and benefits of the procedure and the sedation                            options and risks were discussed with the patient.                            All questions were answered, and informed consent                            was obtained. Prior Anticoagulants: The patient has                            taken no anticoagulant or antiplatelet agents. ASA                            Grade Assessment: II - A patient with mild systemic                            disease. After reviewing the risks and benefits,                            the patient was deemed in satisfactory condition to                            undergo the procedure.                           After obtaining informed consent, the colonoscope  was passed under direct vision. Throughout the                            procedure, the patient's blood pressure, pulse, and                            oxygen saturations were monitored continuously. The                            PCF-HQ190L Colonoscope [id number] was introduced                            through the anus and advanced to the the terminal                            ileum. The colonoscopy was  performed without                            difficulty. The patient tolerated the procedure                            well. The quality of the bowel preparation was                            excellent. The terminal ileum, ileocecal valve,                            appendiceal orifice, and rectum were photographed. Scope In: 2:36:36 PM Scope Out: 2:59:56 PM Scope Withdrawal Time: 0 hours 17 minutes 21 seconds  Total Procedure Duration: 0 hours 23 minutes 20 seconds  Findings:                 The terminal ileum appeared normal.                           Three sessile polyps were found in the transverse                            colon and cecum. The polyps were 3 to 12 mm in                            size. These polyps were removed with a cold snare.                            Resection and retrieval were complete.                           Multiple diverticula were found in the sigmoid                            colon.                           Non-bleeding internal hemorrhoids were found during  retroflexion. Complications:            No immediate complications. Estimated Blood Loss:     Estimated blood loss was minimal. Impression:               - The examined portion of the ileum was normal.                           - Three 3 to 12 mm polyps in the transverse colon                            and in the cecum, removed with a cold snare.                            Resected and retrieved.                           - Diverticulosis in the sigmoid colon.                           - Non-bleeding internal hemorrhoids. Recommendation:           - Discharge patient to home (with escort).                           - Await pathology results.                           - The findings and recommendations were discussed                            with the patient. Dr Particia Lather "[name]" Leonides Schanz,  09/04/2023 3:05:21 PM

## 2023-09-05 ENCOUNTER — Telehealth: Payer: Self-pay

## 2023-09-05 NOTE — Telephone Encounter (Signed)
Attempted f/u call. No answer, left VM. 

## 2023-09-07 ENCOUNTER — Encounter: Payer: Self-pay | Admitting: Internal Medicine

## 2023-09-07 LAB — SURGICAL PATHOLOGY

## 2023-12-14 DIAGNOSIS — Z1231 Encounter for screening mammogram for malignant neoplasm of breast: Secondary | ICD-10-CM | POA: Diagnosis not present

## 2023-12-15 ENCOUNTER — Encounter: Payer: Self-pay | Admitting: Internal Medicine

## 2023-12-20 DIAGNOSIS — K080 Exfoliation of teeth due to systemic causes: Secondary | ICD-10-CM | POA: Diagnosis not present

## 2024-01-17 ENCOUNTER — Ambulatory Visit: Payer: Medicare Other | Admitting: Internal Medicine

## 2024-01-24 ENCOUNTER — Encounter: Payer: Self-pay | Admitting: Internal Medicine

## 2024-01-24 ENCOUNTER — Ambulatory Visit (INDEPENDENT_AMBULATORY_CARE_PROVIDER_SITE_OTHER): Admitting: Internal Medicine

## 2024-01-24 ENCOUNTER — Ambulatory Visit

## 2024-01-24 VITALS — BP 160/80 | HR 75 | Temp 97.9°F | Ht 63.0 in | Wt 111.4 lb

## 2024-01-24 DIAGNOSIS — R221 Localized swelling, mass and lump, neck: Secondary | ICD-10-CM | POA: Diagnosis not present

## 2024-01-24 DIAGNOSIS — R5383 Other fatigue: Secondary | ICD-10-CM | POA: Diagnosis not present

## 2024-01-24 DIAGNOSIS — J439 Emphysema, unspecified: Secondary | ICD-10-CM | POA: Diagnosis not present

## 2024-01-24 DIAGNOSIS — E785 Hyperlipidemia, unspecified: Secondary | ICD-10-CM | POA: Diagnosis not present

## 2024-01-24 DIAGNOSIS — F439 Reaction to severe stress, unspecified: Secondary | ICD-10-CM

## 2024-01-24 DIAGNOSIS — R030 Elevated blood-pressure reading, without diagnosis of hypertension: Secondary | ICD-10-CM

## 2024-01-24 DIAGNOSIS — Z79899 Other long term (current) drug therapy: Secondary | ICD-10-CM

## 2024-01-24 LAB — CBC WITH DIFFERENTIAL/PLATELET
Basophils Absolute: 0 10*3/uL (ref 0.0–0.1)
Basophils Relative: 0.5 % (ref 0.0–3.0)
Eosinophils Absolute: 0.2 10*3/uL (ref 0.0–0.7)
Eosinophils Relative: 2.6 % (ref 0.0–5.0)
HCT: 37.4 % (ref 36.0–46.0)
Hemoglobin: 12.4 g/dL (ref 12.0–15.0)
Lymphocytes Relative: 26.8 % (ref 12.0–46.0)
Lymphs Abs: 1.5 10*3/uL (ref 0.7–4.0)
MCHC: 33.2 g/dL (ref 30.0–36.0)
MCV: 97.9 fl (ref 78.0–100.0)
Monocytes Absolute: 0.4 10*3/uL (ref 0.1–1.0)
Monocytes Relative: 6.8 % (ref 3.0–12.0)
Neutro Abs: 3.6 10*3/uL (ref 1.4–7.7)
Neutrophils Relative %: 63.3 % (ref 43.0–77.0)
Platelets: 274 10*3/uL (ref 150.0–400.0)
RBC: 3.82 Mil/uL — ABNORMAL LOW (ref 3.87–5.11)
RDW: 12.9 % (ref 11.5–15.5)
WBC: 5.7 10*3/uL (ref 4.0–10.5)

## 2024-01-24 LAB — BASIC METABOLIC PANEL
BUN: 21 mg/dL (ref 6–23)
CO2: 30 meq/L (ref 19–32)
Calcium: 9.9 mg/dL (ref 8.4–10.5)
Chloride: 102 meq/L (ref 96–112)
Creatinine, Ser: 0.68 mg/dL (ref 0.40–1.20)
GFR: 84.83 mL/min (ref >60.00)
Glucose, Bld: 111 mg/dL — ABNORMAL HIGH (ref 70–99)
Potassium: 4 meq/L (ref 3.5–5.1)
Sodium: 139 meq/L (ref 135–145)

## 2024-01-24 LAB — LIPID PANEL
Cholesterol: 231 mg/dL — ABNORMAL HIGH (ref 0–200)
HDL: 72.7 mg/dL (ref >39.00)
LDL Cholesterol: 129 mg/dL — ABNORMAL HIGH (ref 0–99)
NonHDL: 158.05
Total CHOL/HDL Ratio: 3
Triglycerides: 145 mg/dL (ref 0.0–149.0)
VLDL: 29 mg/dL (ref 0.0–40.0)

## 2024-01-24 LAB — HEPATIC FUNCTION PANEL
ALT: 10 U/L (ref 0–35)
AST: 16 U/L (ref 0–37)
Albumin: 4.6 g/dL (ref 3.5–5.2)
Alkaline Phosphatase: 61 U/L (ref 39–117)
Bilirubin, Direct: 0.1 mg/dL (ref 0.0–0.3)
Total Bilirubin: 0.5 mg/dL (ref 0.2–1.2)
Total Protein: 7.1 g/dL (ref 6.0–8.3)

## 2024-01-24 LAB — TSH: TSH: 0.82 u[IU]/mL (ref 0.35–5.50)

## 2024-01-24 LAB — T4, FREE: Free T4: 0.76 ng/dL (ref 0.60–1.60)

## 2024-01-24 NOTE — Patient Instructions (Signed)
 This may just be a fat pad swelling  . But getting chest x ray and labs today .   And will fu BP next week at appt.

## 2024-01-24 NOTE — Progress Notes (Signed)
 Chief Complaint  Patient presents with   Mass    Pt reports a lump on R side base of neck. Notice it about a month ago. Increased in size from 3 wks ago. Pt denied any pain.     HPI: Sheri Jensen 78 y.o. come in for above   concern Noted over last month  Thiw seek husband with parkinsons who had been ok had acute decompensation and dehydration admitted to hosp but is still very week and just came home  she is anxious about being able to care for him .   But able to keep appt today  Bp had been ok thinks up today fro anxiety stress.  No cp sob but had some dypnea running to appt  anxiety . Today .  ROS: See pertinent positives and negatives per HPI. No fever systemic sx  pulm cv predating  Past Medical History:  Diagnosis Date   Anxiety    Depression    GERD (gastroesophageal reflux disease)    Hyperlipidemia     Family History  Problem Relation Age of Onset   Colon polyps Mother    Cancer Maternal Grandfather        colon   Colon cancer Maternal Grandfather    Diabetes Paternal Grandfather    Crohn's disease Daughter    Deafness Grandchild        congenital   Esophageal cancer Neg Hx    Pancreatic cancer Neg Hx    Stomach cancer Neg Hx    Rectal cancer Neg Hx     Social History   Socioeconomic History   Marital status: Married    Spouse name: Not on file   Number of children: Not on file   Years of education: Not on file   Highest education level: Not on file  Occupational History   Not on file  Tobacco Use   Smoking status: Never   Smokeless tobacco: Never  Vaping Use   Vaping status: Never Used  Substance and Sexual Activity   Alcohol use: Yes    Comment: 3 glasses a week   Drug use: No   Sexual activity: Not on file  Other Topics Concern   Not on file  Social History Narrative   No tobacco    hh of 2   A cat    etoh 2-3 wine  per day    With meals    Married one son and one daughter   Exercise well.    Occupation:  Former Buyer, retail  Drivers of Community education officer: Not on [employer] Insecurity: Not on file  Transportation Needs: Not on file  Physical Activity: Not on file  Stress: Not on file  Social Connections: Not on file    Outpatient Medications Prior to Visit  Medication Sig Dispense Refill   acetaminophen (TYLENOL) 325 MG tablet Take 650 mg by mouth every 6 (six) hours as needed.     calcium-vitamin D (OSCAL) 250-125 MG-UNIT per tablet Take 1 tablet by mouth daily. Reported on 12/25/2015     diphenhydrAMINE (BENADRYL) 25 MG tablet Take 1 tablet by mouth as needed.     ibuprofen (ADVIL) 100 MG tablet Take 100 mg by mouth every 6 (six) hours as needed for fever.     Multiple Vitamins-Minerals (WOMENS ONE DAILY PO) Take by mouth daily.     DM-Doxylamine-Acetaminophen (NYQUIL COLD & FLU PO) Take by mouth as needed.  famotidine (PEPCID) 20 MG tablet Take 20 mg by mouth 2 (two) times daily. As needed     omeprazole (PRILOSEC) 40 MG capsule Take 40 mg by mouth daily. (Patient not taking: Reported on 01/24/2024)     Simethicone 125 MG CAPS Take by mouth as needed. (Patient not taking: Reported on 01/24/2024)     No facility-administered medications prior to visit.     EXAM:  BP (!) 160/80 (BP Location: Left Arm, Patient Position: Sitting, Cuff Size: Normal)   Pulse 75   Temp 97.9 F (36.6 C) (Oral)   Ht 5\' 3"  (1.6 m)   Wt 111 lb 6.4 oz (50.5 kg)   SpO2 98%   BMI 19.73 kg/m   Body mass index is 19.73 kg/m.  GENERAL: vitals reviewed and listed above, alert, oriented, appears well hydrated and in no acute distress anxious  but nl speech and  affect  HEENT: atraumatic, conjunctiva  clear, no obvious abnormalities on inspection of external nose and ears OP : no lesion edema or exudate  NECK: r supraclvicular area with puffiness assymmetry  soft  area  no  firm adenopathy mild asymmetry of clavicular area  LN no axillary or ob inguinal aden and no abd masses LUNGS: clear to auscultation  bilaterally, no wheezes, rales or rhonchi, good air movement Abdomen:  Sof,t normal bowel sounds without hepatosplenomegaly, no guarding rebound or masses no CVA tenderness CV: HRRR, no clubbing cyanosis or  peripheral edema nl cap refill  MS: moves all extremities without noticeable focal  abnormality PSYCH:nl cognition  acute anxiety from situation from husbands situation and home Lab Results  Component Value Date   WBC 6.3 04/19/2023   HGB 12.5 04/19/2023   HCT 38.0 04/19/2023   PLT 281.0 04/19/2023   GLUCOSE 98 04/19/2023   CHOL 233 (H) 04/19/2023   TRIG 176.0 (H) 04/19/2023   HDL 74.60 04/19/2023   LDLDIRECT 109.9 06/11/2013   LDLCALC 124 (H) 04/19/2023   ALT 10 04/19/2023   AST 18 04/19/2023   NA 141 04/19/2023   K 4.5 04/19/2023   CL 104 04/19/2023   CREATININE 0.76 04/19/2023   BUN 14 04/19/2023   CO2 28 04/19/2023   TSH 1.50 04/19/2023   HGBA1C 5.6 04/19/2023   BP Readings from Last 3 Encounters:  01/24/24 (!) 160/80  09/04/23 118/73  05/17/23 122/68    ASSESSMENT AND PLAN:  Discussed the following assessment and plan:  Neck swelling - r supraclavicular ? fatty r/o ln other mass - Plan: DG Chest 2 View, Basic metabolic panel, CBC with Differential/Platelet, Hepatic function panel, Lipid panel, TSH, T4, free, CT SOFT TISSUE NECK W CONTRAST  Hyperlipidemia, unspecified hyperlipidemia type - Plan: Basic metabolic panel, CBC with Differential/Platelet, Hepatic function panel, Lipid panel, TSH, T4, free  Medication management - Plan: Basic metabolic panel, CBC with Differential/Platelet, Hepatic function panel, Lipid panel, TSH, T4, free  Other fatigue - Plan: Basic metabolic panel, CBC with Differential/Platelet, Hepatic function panel, Lipid panel, TSH, T4, free  Elevated blood pressure reading - Plan: Basic metabolic panel, CBC with Differential/Platelet, Hepatic function panel, Lipid panel, TSH, T4, free  Stress Bp up today could be  acute stress. Had  controlled in past with lsi   Area of concern could be a fat pad and asymmetry of thorax?  But advise we get more information and define  area.  Update lab today  as has cpe next week .  Hopefully this area  is a benign process  as rest of  exam  reassuring  Cxray  today  Order neck ct scan  Keep appt for next week.   -Patient advised to return or notify health care team  if  new concerns arise.  Patient Instructions  This may just be a fat pad swelling  . But getting chest x ray and labs today .   And will fu BP next week at appt.   [name]. Aleicia Kenagy M.D.

## 2024-01-30 ENCOUNTER — Encounter: Payer: Self-pay | Admitting: Internal Medicine

## 2024-01-30 ENCOUNTER — Ambulatory Visit (INDEPENDENT_AMBULATORY_CARE_PROVIDER_SITE_OTHER): Payer: Medicare Other | Admitting: Internal Medicine

## 2024-01-30 VITALS — BP 132/72 | HR 76 | Temp 97.3°F | Ht 63.0 in | Wt 111.8 lb

## 2024-01-30 DIAGNOSIS — F439 Reaction to severe stress, unspecified: Secondary | ICD-10-CM

## 2024-01-30 DIAGNOSIS — E785 Hyperlipidemia, unspecified: Secondary | ICD-10-CM

## 2024-01-30 DIAGNOSIS — Z0000 Encounter for general adult medical examination without abnormal findings: Secondary | ICD-10-CM | POA: Diagnosis not present

## 2024-01-30 DIAGNOSIS — R413 Other amnesia: Secondary | ICD-10-CM | POA: Diagnosis not present

## 2024-01-30 DIAGNOSIS — R030 Elevated blood-pressure reading, without diagnosis of hypertension: Secondary | ICD-10-CM

## 2024-01-30 NOTE — Patient Instructions (Addendum)
 Labs ok . Unrevealing    BP is better   today borderline .  Machine is good enough   to monitor BP ...goal is below 140/90 average  best is 130/80 range and below .  Send in readings of 2-3 days in a row checking 2 x per day .  Proceed with the neck imaging when scheduled but exam is reassuring  ROv in 2 mos   to review all  or as needed

## 2024-01-30 NOTE — Progress Notes (Signed)
 Chief Complaint  Patient presents with   Annual Exam    HPI: Patient  Sheri Jensen  78 y.o. comes in today for Preventive Health Care visit   And fu last  week  visit   anxiety and  addrssing  neck swelling change  felt to maybe  fat pad  but needs work  up . No change or new sx   Husband acute illness and needs  help but now rehisp for fall and now to go to rehab at [facility].  Has bp monitor today .   Concern about memory getting worse  has to write everytheing down and worse recnetly  no lost but has to concentrate  with this.   Bothers her playing games cannot remember some things. ? Neg fam hx of ALzheimer but concern could she have this?   Health Maintenance Review LIFESTYLE:  Exercise:  active  Tobacco/ETS: n Alcohol:  less  Sugar beverages: n Sleep: 8 hours  Drug use: no Husband in hosp parkingsones  had a fall and hydration  iv  to go to  snf for rehab .  Helps some     ROS:  GEN/ HEENT: No fever, significant weight changes sweats headaches vision problems hearing changes, CV/ PULM; No chest pain shortness of breath cough, syncope,edema  change in exercise tolerance. GI /GU: No adominal pain, vomiting, change in bowel habits. No blood in the stool. No significant GU symptoms. SKIN/HEME: ,no acute skin rashes suspicious lesions or bleeding. No lymphadenopathy, nodules, masses.  NEURO/ PSYCH:  No neurologic signs such as weakness numbness. No depression anxiety. IMM/ Allergy: No unusual infections.  Allergy .   REST of 12 system review negative except as per HPI   Past Medical History:  Diagnosis Date   Anxiety    Depression    GERD (gastroesophageal reflux disease)    Hyperlipidemia     Past Surgical History:  Procedure Laterality Date   chidbirth     x2   COLONOSCOPY  03/00   2/10- normal     Family History  Problem Relation Age of Onset   Colon polyps Mother    Cancer Maternal Grandfather        colon   Colon cancer Maternal Grandfather     Diabetes Paternal Grandfather    Crohn's disease Daughter    Deafness Grandchild        congenital   Esophageal cancer Neg Hx    Pancreatic cancer Neg Hx    Stomach cancer Neg Hx    Rectal cancer Neg Hx     Social History   Socioeconomic History   Marital status: Married    Spouse name: Not on file   Number of children: Not on file   Years of education: Not on file   Highest education level: Not on file  Occupational History   Not on file  Tobacco Use   Smoking status: Never   Smokeless tobacco: Never  Vaping Use   Vaping status: Never Used  Substance and Sexual Activity   Alcohol use: Yes    Comment: 3 glasses a week   Drug use: No   Sexual activity: Not on file  Other Topics Concern   Not on file  Social History Narrative   No tobacco    hh of 2   A cat    etoh 2-3 wine  per day    With meals    Married one son and one daughter   Exercise  well.    Occupation:  Former Programmer, systems   Social Drivers of Corporate investment banker Strain: Not on [employer] Insecurity: Not on file  Transportation Needs: Not on file  Physical Activity: Inactive (01/30/2024)   Exercise Vital Sign    Days of Exercise per Week: 0 days    Minutes of Exercise per Session: 0 min  Stress: Not on file  Social Connections: Not on file    Outpatient Medications Prior to Visit  Medication Sig Dispense Refill   acetaminophen (TYLENOL) 325 MG tablet Take 650 mg by mouth every 6 (six) hours as needed.     calcium-vitamin D (OSCAL) 250-125 MG-UNIT per tablet Take 1 tablet by mouth daily. Reported on 12/25/2015     diphenhydrAMINE (BENADRYL) 25 MG tablet Take 1 tablet by mouth as needed.     DM-Doxylamine-Acetaminophen (NYQUIL COLD & FLU PO) Take by mouth as needed.     famotidine (PEPCID) 20 MG tablet Take 20 mg by mouth 2 (two) times daily. As needed     ibuprofen (ADVIL) 100 MG tablet Take 100 mg by mouth every 6 (six) hours as needed for fever.     Multiple Vitamins-Minerals (WOMENS ONE DAILY  PO) Take by mouth daily.     omeprazole (PRILOSEC) 40 MG capsule Take 40 mg by mouth daily. (Patient not taking: Reported on 01/30/2024)     Simethicone 125 MG CAPS Take by mouth as needed. (Patient not taking: Reported on 01/30/2024)     No facility-administered medications prior to visit.     EXAM:  BP 132/72 (BP Location: Left Arm, Patient Position: Sitting, Cuff Size: Normal)   Pulse 76   Temp (!) 97.3 F (36.3 C) (Oral)   Ht 5\' 3"  (1.6 m)   Wt 111 lb 12.8 oz (50.7 kg)   SpO2 98%   BMI 19.80 kg/m   Body mass index is 19.8 kg/m. Wt Readings from Last 3 Encounters:  01/30/24 111 lb 12.8 oz (50.7 kg)  01/24/24 111 lb 6.4 oz (50.5 kg)  09/04/23 115 lb (52.2 kg)    Physical Exam: Vital signs reviewed HQI:ONGE is a well-developed well-nourished alert cooperative    who appearsr stated age in no acute distress.  HEENT: normocephalic atraumatic , Eyes: PERRL EOM's full, conjunctiva clear, Nares: paten,t no deformity discharge or tenderness., Ears: no deformity EAC's clear TMs with normal landmarks. Mouth: clear OP, no lesions, edema.  Moist mucous membranes. Dentition in adequate repair. NECK: supple puffiness  assymmetry  r Justice are  but no firm mass thyromegaly or bruits. CHEST/PULM:  Clear to auscultation and percussion breath sounds equal no wheeze , rales or rhonchi. No chest wall deformities or tenderness. Breast: normal by inspection . No dimpling, discharge, masses, tenderness or discharge . CV: PMI is nondisplaced, S1 S2 no gallops, murmurs, rubs. Peripheral pulses are full without delay.No JVD .  ABDOMEN: Bowel sounds normal nontender  No guard or rebound, no hepato splenomegal no CVA tenderness.  No hernia. Extremtities:  No clubbing cyanosis or edema, no acute joint swelling or redness no focal atrophy NEURO:  Oriented x3, cranial nerves 3-12 appear to be intact, no obvious focal weakness,gait within normal limits no abnormal reflexes or asymmetrical SKIN: No acute rashes  normal turgor, color, no bruising or petechiae. PSYCH: Oriented, good eye contact, no obvious depression anxiety, cognition and judgment appear normal. LN: no cervical axillaryadenopathy  Lab Results  Component Value Date   WBC 5.7 01/24/2024   HGB 12.4 01/24/2024  HCT 37.4 01/24/2024   PLT 274.0 01/24/2024   GLUCOSE 111 (H) 01/24/2024   CHOL 231 (H) 01/24/2024   TRIG 145.0 01/24/2024   HDL 72.70 01/24/2024   LDLDIRECT 109.9 06/11/2013   LDLCALC 129 (H) 01/24/2024   ALT 10 01/24/2024   AST 16 01/24/2024   NA 139 01/24/2024   K 4.0 01/24/2024   CL 102 01/24/2024   CREATININE 0.68 01/24/2024   BUN 21 01/24/2024   CO2 30 01/24/2024   TSH 0.82 01/24/2024   HGBA1C 5.6 04/19/2023  Above was non fasting  BP Readings from Last 3 Encounters:  01/30/24 132/72  01/24/24 (!) 160/80  09/04/23 118/73   Bp repeat office cuff 138/76 her monitor 141/79  Lab results reviewed with patient   ASSESSMENT AND PLAN:  Discussed the following assessment and plan:    ICD-10-CM   1. Visit for preventive health examination  Z00.00     2. Complaints of memory disturbance  R41.3     3. Elevated blood pressure reading  R03.0     4. Hyperlipidemia, unspecified hyperlipidemia type  E78.5     5. Stress  F43.9     141/81 138/76  Disc more readings sent to [place]  she doesn't use my chart so can send in drop off other . She is concern about on going memory issue despite sleep other activity  of course worse with recent stress.  Refer to neurology    non focal exam today although has felt off balance in past but felt to be from a medication  never had head imaging?  No obv neuropathy today.  Consider neuropsych eval.  Plan fu in 2 mos bp other    Return in about 2 months (around 03/31/2024) for bp etc .  Patient Care Team: Madelin Headings, MD as PCP - General Patient Instructions  Labs ok . Unrevealing    BP is better   today borderline .  Machine is good enough   to monitor BP ...goal is  below 140/90 average  best is 130/80 range and below .  Send in readings of 2-3 days in a row checking 2 x per day .  Proceed with the neck imaging when scheduled but exam is reassuring  ROv in 2 mos   to review all  or as needed     Burna Mortimer K. Benjiman Sedgwick M.D.

## 2024-02-06 ENCOUNTER — Encounter: Payer: Self-pay | Admitting: Physician Assistant

## 2024-02-12 ENCOUNTER — Encounter: Payer: Self-pay | Admitting: Internal Medicine

## 2024-02-12 NOTE — Progress Notes (Signed)
 XRay shows no acute  findings or lesions but radiologist says to consider  emphysema changes .    Would need to do lung functions and or ct scan of chest to define this  . If breathing is a problem over and above stress sx  we can use  inhalers .  But at this point lets  just do the fu appt  however if feeling   concerning short of breath make a fu  appt  before planned visit to discuss

## 2024-02-19 ENCOUNTER — Ambulatory Visit
Admission: RE | Admit: 2024-02-19 | Discharge: 2024-02-19 | Disposition: A | Discharge status: Home / Self Care | Source: Ambulatory Visit | Attending: Internal Medicine | Admitting: Internal Medicine

## 2024-02-19 DIAGNOSIS — R221 Localized swelling, mass and lump, neck: Secondary | ICD-10-CM

## 2024-02-19 MED ORDER — IOPAMIDOL (ISOVUE-300) INJECTION 61%
75.0000 mL | Freq: Once | INTRAVENOUS | Status: AC | PRN
  Administered 2024-02-19: 75 mL via INTRAVENOUS

## 2024-03-18 ENCOUNTER — Encounter: Payer: Self-pay | Admitting: Internal Medicine

## 2024-03-18 NOTE — Progress Notes (Signed)
 Good news   neck ct shows no masses lumps of findings of significance

## 2024-03-20 ENCOUNTER — Other Ambulatory Visit

## 2024-03-20 ENCOUNTER — Encounter: Payer: Self-pay | Admitting: Physician Assistant

## 2024-03-20 ENCOUNTER — Ambulatory Visit: Admitting: Physician Assistant

## 2024-03-20 ENCOUNTER — Ambulatory Visit

## 2024-03-20 VITALS — BP 149/78 | Resp 20 | Ht 63.0 in | Wt 110.0 lb

## 2024-03-20 DIAGNOSIS — R413 Other amnesia: Secondary | ICD-10-CM | POA: Diagnosis not present

## 2024-03-20 NOTE — Patient Instructions (Addendum)
 It was a pleasure to see you today at our office.   Recommendations:  Neurocognitive evaluation at our office   MRI of the brain, the radiology office will call you to arrange you appointment  [phone] at [facility] Check labs today [address] Consider psychotherapy Follow up Aug 15 at 11:30  Make sure you talk to your doctor about irregular heart beat  Recommend visiting the website : " Dementia Success Path" to better understand some behaviors related to memory loss.    For psychiatric meds, mood meds: Please have your primary care physician manage these medications.  If you have any severe symptoms of a stroke, or other severe issues such as confusion,severe chills or fever, etc call 911 or go to the ER as you may need to be evaluated further   For guidance regarding [facility] and if placement were needed at the facility, contact Social Worker tel: [phone]  For assessment of decision of mental capacity and competency:  Call Dr. Laverne Potter, geriatric psychiatrist at [phone]  Counseling regarding caregiver distress, including caregiver depression, anxiety and issues regarding community resources, adult day care programs, adult living facilities, or memory care questions:  please contact your  Primary Doctor's Social Worker   Whom to call: Memory  decline, memory medications: Call our office [phone]    [email or web address]   RECOMMENDATIONS FOR ALL PATIENTS WITH MEMORY PROBLEMS: 1. Continue to exercise (Recommend 30 minutes of walking everyday, or 3 hours every week) 2. Increase social interactions - continue going to [place] and enjoy social gatherings with friends and family 3. Eat healthy, avoid fried foods and eat more fruits and vegetables 4. Maintain adequate blood pressure, blood sugar, and blood cholesterol level. Reducing the risk of stroke and cardiovascular disease also helps promoting  better memory. 5. Avoid stressful situations. Live a simple life and avoid aggravations. Organize your time and prepare for the next day in anticipation. 6. Sleep well, avoid any interruptions of sleep and avoid any distractions in the bedroom that may interfere with adequate sleep quality 7. Avoid sugar, avoid sweets as there is a strong link between excessive sugar intake, diabetes, and cognitive impairment We discussed the Mediterranean diet, which has been shown to help patients reduce the risk of progressive memory disorders and reduces cardiovascular risk. This includes eating fish, eat fruits and green leafy vegetables, nuts like almonds and hazelnuts, walnuts, and also use olive oil. Avoid fast foods and fried foods as much as possible. Avoid sweets and sugar as sugar use has been linked to worsening of memory function.  There is always a concern of gradual progression of memory problems. If this is the case, then we may need to adjust level of care according to patient needs. Support, both to the patient and caregiver, should then be put into place.      You have been referred for a neuropsychological evaluation (i.e., evaluation of memory and thinking abilities). Please bring someone with you to this appointment if possible, as it is helpful for the doctor to hear from both you and another adult who knows you well. Please bring eyeglasses and hearing aids if you wear them.    The evaluation will take approximately 3 hours and has two parts:   The first part is a clinical interview with the neuropsychologist (Dr. Kitty Perkins or Dr. Donavon Fudge). During the interview, the neuropsychologist will speak with you and the individual you brought to the appointment.    The second part of the evaluation  is testing with the doctor's technician Bernabe Brew or Burdette Carolin). During the testing, the technician will ask you to remember different types of material, solve problems, and answer some questionnaires. Your family member  will not be present for this portion of the evaluation.   Please note: We must reserve several hours of the neuropsychologist's time and the psychometrician's time for your evaluation appointment. As such, there is a No-Show fee of $100. If you are unable to attend any of your appointments, please contact our office as soon as possible to reschedule.      DRIVING: Regarding driving, in patients with progressive memory problems, driving will be impaired. We advise to have someone else do the driving if trouble finding directions or if minor accidents are reported. Independent driving assessment is available to determine safety of driving.   If you are interested in the driving assessment, you can contact the following:  [employer] in [place] [phone]  [facility] [phone]  [facility] [phone]  [facility] [phone] or [phone]   FALL PRECAUTIONS: Be cautious when walking. Scan the area for obstacles that may increase the risk of trips and falls. When getting up in the mornings, sit up at the edge of the bed for a few minutes before getting out of bed. Consider elevating the bed at the head end to avoid drop of blood pressure when getting up. Walk always in a well-lit room (use night lights in the walls). Avoid area rugs or power cords from appliances in the middle of the walkways. Use a walker or a cane if necessary and consider physical therapy for balance exercise. Get your eyesight checked regularly.  FINANCIAL OVERSIGHT: Supervision, especially oversight when making financial decisions or transactions is also recommended.  HOME SAFETY: Consider the safety of the kitchen when operating appliances like stoves, microwave oven, and blender. Consider having supervision and share cooking responsibilities until no longer able to participate in those. Accidents with firearms and other hazards in the house should be identified  and addressed as well.   ABILITY TO BE LEFT ALONE: If patient is unable to contact 911 operator, consider using LifeLine, or when the need is there, arrange for someone to stay with patients. Smoking is a fire hazard, consider supervision or cessation. Risk of wandering should be assessed by caregiver and if detected at any point, supervision and safe proof recommendations should be instituted.  MEDICATION SUPERVISION: Inability to self-administer medication needs to be constantly addressed. Implement a mechanism to ensure safe administration of the medications.      Mediterranean Diet A Mediterranean diet refers to food and lifestyle choices that are based on the traditions of countries located on the [employer] [place]. This way of eating has been shown to help prevent certain conditions and improve outcomes for people who have chronic diseases, like kidney disease and heart disease. What are tips for following this plan? Lifestyle  Cook and eat meals together with your family, when possible. Drink enough fluid to keep your urine clear or pale yellow. Be physically active every day. This includes: Aerobic exercise like running or swimming. Leisure activities like gardening, walking, or housework. Get 7-8 hours of sleep each night. If recommended by your health care provider, drink red wine in moderation. This means 1 glass a day for nonpregnant women and 2 glasses a day for men. A glass of wine equals 5 oz (150 mL). Reading food labels  Check the serving size of packaged foods. For foods such  as rice and pasta, the serving size refers to the amount of cooked product, not dry. Check the total fat in packaged foods. Avoid foods that have saturated fat or trans fats. Check the ingredients list for added sugars, such as corn syrup. Shopping  At the grocery store, buy most of your food from the areas near the walls of the store. This includes: Fresh fruits and vegetables (produce). Grains,  beans, nuts, and seeds. Some of these may be available in unpackaged forms or large amounts (in bulk). Fresh seafood. Poultry and eggs. Low-fat dairy products. Buy whole ingredients instead of prepackaged foods. Buy fresh fruits and vegetables in-season from local farmers markets. Buy frozen fruits and vegetables in resealable bags. If you do not have access to quality fresh seafood, buy precooked frozen shrimp or canned fish, such as tuna, salmon, or sardines. Buy small amounts of raw or cooked vegetables, salads, or olives from the deli or salad bar at your store. Stock your pantry so you always have certain foods on hand, such as olive oil, canned tuna, canned tomatoes, rice, pasta, and beans. Cooking  Cook foods with extra-virgin olive oil instead of using butter or other vegetable oils. Have meat as a side dish, and have vegetables or grains as your main dish. This means having meat in small portions or adding small amounts of meat to foods like pasta or stew. Use beans or vegetables instead of meat in common dishes like chili or lasagna. Experiment with different cooking methods. Try roasting or broiling vegetables instead of steaming or sauteing them. Add frozen vegetables to soups, stews, pasta, or rice. Add nuts or seeds for added healthy fat at each meal. You can add these to yogurt, salads, or vegetable dishes. Marinate fish or vegetables using olive oil, lemon juice, garlic, and fresh herbs. Meal planning  Plan to eat 1 vegetarian meal one day each week. Try to work up to 2 vegetarian meals, if possible. Eat seafood 2 or more times a week. Have healthy snacks readily available, such as: Vegetable sticks with hummus. Greek yogurt. Fruit and nut trail mix. Eat balanced meals throughout the week. This includes: Fruit: 2-3 servings a day Vegetables: 4-5 servings a day Low-fat dairy: 2 servings a day Fish, poultry, or lean meat: 1 serving a day Beans and legumes: 2 or more  servings a week Nuts and seeds: 1-2 servings a day Whole grains: 6-8 servings a day Extra-virgin olive oil: 3-4 servings a day Limit red meat and sweets to only a few servings a month What are my food choices? Mediterranean diet Recommended Grains: Whole-grain pasta. Brown rice. Bulgar wheat. Polenta. Couscous. Whole-wheat bread. Dwyane Glad. Vegetables: Artichokes. Beets. Broccoli. Cabbage. Carrots. Eggplant. Green beans. Chard. Kale. Spinach. Onions. Leeks. Peas. Squash. Tomatoes. Peppers. Radishes. Fruits: Apples. Apricots. Avocado. Berries. Bananas. Cherries. Dates. Figs. Grapes. Lemons. Melon. Oranges. Peaches. Plums. Pomegranate. Meats and other protein foods: Beans. Almonds. Sunflower seeds. Pine nuts. Peanuts. Cod. Salmon. Scallops. Shrimp. Tuna. Tilapia. Clams. Oysters. Eggs. Dairy: Low-fat milk. Cheese. Greek yogurt. Beverages: Water. Red wine. Herbal tea. Fats and oils: Extra virgin olive oil. Avocado oil. Grape seed oil. Sweets and desserts: [place] yogurt with honey. Baked apples. Poached pears. Trail mix. Seasoning and other foods: Basil. Cilantro. Coriander. Cumin. Mint. Parsley. Sage. Rosemary. Tarragon. Garlic. Oregano. Thyme. Pepper. Balsalmic vinegar. Tahini. Hummus. Tomato sauce. Olives. Mushrooms. Limit these Grains: Prepackaged pasta or rice dishes. Prepackaged cereal with added sugar. Vegetables: Deep fried potatoes (french fries). Fruits: Fruit canned in syrup. Meats  and other protein foods: Beef. Pork. Lamb. Poultry with skin. Hot dogs. Helene Loader. Dairy: Ice cream. Sour cream. Whole milk. Beverages: Juice. Sugar-sweetened soft drinks. Beer. Liquor and spirits. Fats and oils: Butter. Canola oil. Vegetable oil. Beef fat (tallow). Lard. Sweets and desserts: Cookies. Cakes. Pies. Candy. Seasoning and other foods: Mayonnaise. Premade sauces and marinades. The items listed may not be a complete list. Talk with your dietitian about what dietary choices are right for  you. Summary The Mediterranean diet includes both food and lifestyle choices. Eat a variety of fresh fruits and vegetables, beans, nuts, seeds, and whole grains. Limit the amount of red meat and sweets that you eat. Talk with your health care provider about whether it is safe for you to drink red wine in moderation. This means 1 glass a day for nonpregnant women and 2 glasses a day for men. A glass of wine equals 5 oz (150 mL). This information is not intended to replace advice given to you by your health care provider. Make sure you discuss any questions you have with your health care provider. Document Released: 06/23/2016 Document Revised: 07/26/2016 Document Reviewed: 06/23/2016 Elsevier Interactive Patient Education  2017 [employer].
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
## 2024-03-21 ENCOUNTER — Encounter: Payer: Self-pay | Admitting: Physician Assistant

## 2024-03-21 DIAGNOSIS — R413 Other amnesia: Secondary | ICD-10-CM | POA: Insufficient documentation

## 2024-03-21 NOTE — Progress Notes (Signed)
 B12 and Thyroid  levels are normal, B1 is pending, thanks

## 2024-03-24 LAB — TSH: TSH: 1.17 m[IU]/L (ref 0.40–4.50)

## 2024-03-24 LAB — VITAMIN B1: Vitamin B1 (Thiamine): 16 nmol/L (ref 8–30)

## 2024-03-24 LAB — VITAMIN B12: Vitamin B-12: 772 pg/mL (ref 200–1100)

## 2024-03-26 ENCOUNTER — Ambulatory Visit: Payer: Self-pay | Admitting: Physician Assistant

## 2024-04-01 ENCOUNTER — Telehealth: Payer: Self-pay | Admitting: Physician Assistant

## 2024-04-01 ENCOUNTER — Other Ambulatory Visit: Payer: Self-pay | Admitting: Physician Assistant

## 2024-04-01 MED ORDER — DIAZEPAM 2 MG PO TABS: ORAL_TABLET | ORAL | 0 refills | Status: AC

## 2024-04-01 NOTE — Telephone Encounter (Signed)
 Patient has a MRI 04-06-24 and needs something called in to help her relax during the MRI   Wilmer Hash  at  [facility]

## 2024-04-02 ENCOUNTER — Ambulatory Visit: Admitting: Internal Medicine

## 2024-04-06 ENCOUNTER — Ambulatory Visit
Admission: RE | Admit: 2024-04-06 | Discharge: 2024-04-06 | Disposition: A | Discharge status: Home / Self Care | Source: Ambulatory Visit | Attending: Physician Assistant | Admitting: Physician Assistant

## 2024-04-08 NOTE — Progress Notes (Unsigned)
 No chief complaint on file.   HPI: Sheri Jensen 78 y.o. come in for Chronic disease management  Saw neuro for initial  co about memry ROS: See pertinent positives and negatives per HPI.  Past Medical History:  Diagnosis Date   Anxiety    Depression    GERD (gastroesophageal reflux disease)    Hyperlipidemia     Family History  Problem Relation Age of Onset   Colon polyps Mother    Cancer Maternal Grandfather        colon   Colon cancer Maternal Grandfather    Diabetes Paternal Grandfather    Crohn's disease Daughter    Deafness Grandchild        congenital   Esophageal cancer Neg Hx    Pancreatic cancer Neg Hx    Stomach cancer Neg Hx    Rectal cancer Neg Hx     Social History   Socioeconomic History   Marital status: Married    Spouse name: Not on file   Number of children: 2   Years of education: 18   Highest education level: Not on file  Occupational History   Not on file  Tobacco Use   Smoking status: Never   Smokeless tobacco: Never  Vaping Use   Vaping status: Never Used  Substance and Sexual Activity   Alcohol use: Yes    Comment: 3 glasses a week   Drug use: No   Sexual activity: Not on file  Other Topics Concern   Not on file  Social History Narrative   No tobacco hh of 2   A cat etoh 2-3 wine  per day    With meals Married one son and one daughterExercise well. Occupation:  Former Programmer, systems   Left handed      No caffeine   Lives with husband   Two story home   Two granddaughters   Social Drivers of Corporate investment banker Strain: Not on file  Food Insecurity: Not on file  Transportation Needs: Not on file  Physical Activity: Inactive (01/30/2024)   Exercise Vital Sign    Days of Exercise per Week: 0 days    Minutes of Exercise per Session: 0 min  Stress: Not on file  Social Connections: Not on file    Outpatient Medications Prior to Visit  Medication Sig Dispense Refill   acetaminophen (TYLENOL) 325 MG tablet Take 650 mg  by mouth every 6 (six) hours as needed.     calcium-vitamin D (OSCAL) 250-125 MG-UNIT per tablet Take 1 tablet by mouth daily. Reported on 12/25/2015     diazepam  (VALIUM ) 2 MG tablet Take 1 tab 30 minutes prior to MRI, may add an additional tab if needed 2 tablet 0   diphenhydrAMINE  (BENADRYL ) 25 MG tablet Take 1 tablet by mouth as needed.     DM-Doxylamine-Acetaminophen (NYQUIL COLD & FLU PO) Take by mouth as needed. (Patient not taking: Reported on 03/20/2024)     famotidine  (PEPCID ) 20 MG tablet Take 20 mg by mouth 2 (two) times daily. As needed     ibuprofen (ADVIL) 100 MG tablet Take 100 mg by mouth every 6 (six) hours as needed for fever.     Multiple Vitamins-Minerals (WOMENS ONE DAILY PO) Take by mouth daily.     omeprazole  (PRILOSEC) 40 MG capsule Take 40 mg by mouth daily. (Patient not taking: Reported on 01/24/2024)     Simethicone  125 MG CAPS Take by mouth as needed. (Patient not taking: Reported  on 01/24/2024)     No facility-administered medications prior to visit.     EXAM:  There were no vitals taken for this visit.  There is no height or weight on file to calculate BMI.  GENERAL: vitals reviewed and listed above, alert, oriented, appears well hydrated and in no acute distress HEENT: atraumatic, conjunctiva  clear, no obvious abnormalities on inspection of external nose and ears OP : no lesion edema or exudate  NECK: no obvious masses on inspection palpation  LUNGS: clear to auscultation bilaterally, no wheezes, rales or rhonchi, good air movement CV: HRRR, no clubbing cyanosis or  peripheral edema nl cap refill  MS: moves all extremities without noticeable focal  abnormality PSYCH: pleasant and cooperative, no obvious depression or anxiety Lab Results  Component Value Date   WBC 5.7 01/24/2024   HGB 12.4 01/24/2024   HCT 37.4 01/24/2024   PLT 274.0 01/24/2024   GLUCOSE 111 (H) 01/24/2024   CHOL 231 (H) 01/24/2024   TRIG 145.0 01/24/2024   HDL 72.70 01/24/2024    LDLDIRECT 109.9 06/11/2013   LDLCALC 129 (H) 01/24/2024   ALT 10 01/24/2024   AST 16 01/24/2024   NA 139 01/24/2024   K 4.0 01/24/2024   CL 102 01/24/2024   CREATININE 0.68 01/24/2024   BUN 21 01/24/2024   CO2 30 01/24/2024   TSH 1.17 03/20/2024   HGBA1C 5.6 04/19/2023   BP Readings from Last 3 Encounters:  03/20/24 (!) 149/78  01/30/24 132/72  01/24/24 (!) 160/80    ASSESSMENT AND PLAN:  Discussed the following assessment and plan:  No diagnosis found.  -Patient advised to return or notify health care team  if  new concerns arise.  There are no Patient Instructions on file for this visit.   Makahla Kiser K. Anand Tejada M.D.

## 2024-04-09 ENCOUNTER — Encounter: Payer: Self-pay | Admitting: Internal Medicine

## 2024-04-09 ENCOUNTER — Ambulatory Visit (INDEPENDENT_AMBULATORY_CARE_PROVIDER_SITE_OTHER): Admitting: Internal Medicine

## 2024-04-09 VITALS — BP 152/76 | HR 116 | Temp 97.7°F | Ht 63.0 in | Wt 110.4 lb

## 2024-04-09 DIAGNOSIS — F439 Reaction to severe stress, unspecified: Secondary | ICD-10-CM | POA: Diagnosis not present

## 2024-04-09 DIAGNOSIS — F419 Anxiety disorder, unspecified: Secondary | ICD-10-CM

## 2024-04-09 DIAGNOSIS — R413 Other amnesia: Secondary | ICD-10-CM

## 2024-04-09 DIAGNOSIS — Z79899 Other long term (current) drug therapy: Secondary | ICD-10-CM

## 2024-04-09 DIAGNOSIS — R030 Elevated blood-pressure reading, without diagnosis of hypertension: Secondary | ICD-10-CM

## 2024-04-09 MED ORDER — BUSPIRONE HCL 15 MG PO TABS: ORAL_TABLET | ORAL | 1 refills | Status: DC

## 2024-04-09 NOTE — Patient Instructions (Addendum)
 Will begin new medication for anxiety that  you take daily . And adjust dose as we go .  Keep appts with neuropsychologist   Continue lifestyle intervention healthy eating and exercise .   Fu in about 6 weeks and we can recheck BP  assessment then .   check BP readings at home as before and treat as we need to .   Neck  is fine.

## 2024-05-08 ENCOUNTER — Encounter: Payer: Self-pay | Admitting: Psychology

## 2024-05-08 DIAGNOSIS — F32A Depression, unspecified: Secondary | ICD-10-CM | POA: Insufficient documentation

## 2024-05-08 DIAGNOSIS — F419 Anxiety disorder, unspecified: Secondary | ICD-10-CM | POA: Insufficient documentation

## 2024-05-08 DIAGNOSIS — K219 Gastro-esophageal reflux disease without esophagitis: Secondary | ICD-10-CM | POA: Insufficient documentation

## 2024-05-09 ENCOUNTER — Ambulatory Visit: Payer: Self-pay

## 2024-05-09 ENCOUNTER — Ambulatory Visit: Admitting: Psychology

## 2024-05-09 ENCOUNTER — Encounter: Payer: Self-pay | Admitting: Psychology

## 2024-05-09 DIAGNOSIS — F330 Major depressive disorder, recurrent, mild: Secondary | ICD-10-CM

## 2024-05-09 DIAGNOSIS — R4189 Other symptoms and signs involving cognitive functions and awareness: Secondary | ICD-10-CM

## 2024-05-09 DIAGNOSIS — F419 Anxiety disorder, unspecified: Secondary | ICD-10-CM
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
## 2024-05-09 NOTE — Progress Notes (Signed)
   Psychometrician Note   Cognitive testing was administered to Sheri Jensen by Luke Pitcher, B.S. (psychometrist) under the supervision of Dr. Arthea KYM Maryland, Ph.D., ABPP, licensed psychologist on 05/09/2024. Sheri Jensen did not appear overtly distressed by the testing session per behavioral observation or responses across self-report questionnaires. Rest breaks were offered.    The battery of tests administered was selected by Dr. Zachary C. Merz, Ph.D., ABPP with consideration to Sheri Jensen's current level of functioning, the nature of her symptoms, emotional and behavioral responses during interview, level of literacy, observed level of motivation/effort, and the nature of the referral question. This battery was communicated to the psychometrist. Communication between Dr. Arthea KYM Maryland, Ph.D., ABPP and the psychometrist was ongoing throughout the evaluation and Dr. Arthea KYM Maryland, Ph.D., ABPP was immediately accessible at all times. Dr. Zachary C. Merz, Ph.D., ABPP provided supervision to the psychometrist on the date of this service to the extent necessary to assure the quality of all services provided.    Sheri Jensen will return within approximately 1-2 weeks for an interactive feedback session with Dr. Maryland at which time her test performances, clinical impressions, and treatment recommendations will be reviewed in detail. Sheri Jensen understands she can contact our office should she require our assistance before this time.  A total of 105 minutes of billable time were spent face-to-face with Sheri Jensen by the psychometrist. This includes both test administration and scoring time. Billing for these services is reflected in the clinical report generated by Dr. Arthea KYM Maryland, Ph.D., ABPP  This note reflects time spent with the psychometrician and does not include test scores or any clinical interpretations made by Dr. Maryland. The full report will follow in a separate note.

## 2024-05-15 ENCOUNTER — Ambulatory Visit: Admitting: Physician Assistant

## 2024-05-21 ENCOUNTER — Encounter: Payer: Self-pay | Admitting: Internal Medicine

## 2024-05-21 ENCOUNTER — Ambulatory Visit: Admitting: Internal Medicine

## 2024-05-21 VITALS — BP 138/80 | HR 70 | Temp 97.7°F | Ht 63.0 in | Wt 110.4 lb

## 2024-05-21 DIAGNOSIS — Z79899 Other long term (current) drug therapy: Secondary | ICD-10-CM | POA: Diagnosis not present

## 2024-05-21 DIAGNOSIS — R030 Elevated blood-pressure reading, without diagnosis of hypertension: Secondary | ICD-10-CM

## 2024-05-21 DIAGNOSIS — F4323 Adjustment disorder with mixed anxiety and depressed mood: Secondary | ICD-10-CM

## 2024-05-21 DIAGNOSIS — R413 Other amnesia: Secondary | ICD-10-CM

## 2024-05-21 DIAGNOSIS — Z636 Dependent relative needing care at home: Secondary | ICD-10-CM | POA: Diagnosis not present

## 2024-05-21 NOTE — Patient Instructions (Signed)
 Stay on the buspirone  at 1 twice a day for another 1-2 weeks to see if helpful . I agree with caretakers  group.  May be helpful over time.   If not   help with buspar  after another 2-3 weeks  we can change to the Wellbutrin  and ramp up.   BP was 138/80 on second trial.

## 2024-05-21 NOTE — Progress Notes (Signed)
 Chief Complaint  Patient presents with   Medical Management of Chronic Issues    6 wk follow up. Pt reports no change.     HPI: Sheri Jensen 78 y.o. come in for Chronic disease management  Since lst visit  just went up to buspar  1 bid  tolerating this am  not seeing pos effeect.  Difficult with husband parkinson es and med issues . She has to sometimes help him get up from chair  and she says doesn't follow med direction for some   brhaviors that are preventive  She enjoys  her monthly eating out with friends  Just signed up for caregiver group but had to delay cause of his med appts.   ROS: See pertinent positives and negatives per HPI.  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     Postnasal drip 10/16/2009   Urinary frequency 08/10/2010    Family History  Problem Relation Age of Onset   Colon polyps Mother    Cancer Maternal Grandfather        colon   Colon cancer Maternal Grandfather    Diabetes Paternal Grandfather    Crohn's disease Daughter    Deafness Grandchild        congenital   Esophageal cancer Neg Hx    Pancreatic cancer Neg Hx    Stomach cancer Neg Hx    Rectal cancer Neg Hx     Social History   Socioeconomic History   Marital status: Married    Spouse name: Not on file   Number of children: 2   Years of education: 16   Highest education level: Bachelor's degree (e.g., BA, AB, BS)  Occupational History   Occupation: Retired    Comment: Pension scheme manager  Tobacco Use   Smoking status: Never   Smokeless tobacco: Never  Vaping Use   Vaping status: Never Used  Substance and Sexual Activity   Alcohol use: Yes    Alcohol/week: 4.0 standard drinks of alcohol    Types: 4 Glasses of wine per week   Drug use: No   Sexual activity: Not on file  Other Topics Concern   Not on file  Social History Narrative   No tobacco hh of 2   A cat etoh 2-3 wine  per day    With meals Married one son and one daughterExercise well. Occupation:  Former Programmer, systems   Left handed      No caffeine   Lives with husband   Two story home   Two granddaughters   Social Drivers of Corporate investment banker Strain: Not on file  Food Insecurity: Not on file  Transportation Needs: Not on file  Physical Activity: Inactive (01/30/2024)   Exercise Vital Sign    Days of Exercise per Week: 0 days    Minutes of Exercise per Session: 0 min  Stress: Not on file  Social Connections: Not on file    Outpatient Medications Prior to Visit  Medication  Sig Dispense Refill   acetaminophen (TYLENOL) 325 MG tablet Take 650 mg by mouth every 6 (six) hours as needed.     busPIRone  (BUSPAR ) 15 MG tablet 7.5 mg orally twice a day; ( may increase to 10 mg twice a day  after   3 days if needed ; ) or anxiety 40 tablet 1   calcium-vitamin D (OSCAL) 250-125 MG-UNIT per tablet Take 1 tablet by mouth daily. Reported on 12/25/2015     diphenhydrAMINE  (BENADRYL ) 25 MG tablet Take 1 tablet by mouth as needed.     famotidine  (PEPCID ) 20 MG tablet Take 20 mg by mouth 2 (two) times daily. As needed     ibuprofen (ADVIL) 100 MG tablet Take 100 mg by mouth every 6 (six) hours as needed for fever.     Multiple Vitamins-Minerals (WOMENS ONE DAILY PO) Take by mouth daily.     omeprazole  (PRILOSEC) 40 MG capsule Take 40 mg by mouth daily. (Patient taking differently: Take 40 mg by mouth daily. PRN)     diazepam  (VALIUM ) 2 MG tablet Take 1 tab 30 minutes prior to MRI, may add an additional tab if  needed (Patient not taking: Reported on 05/21/2024) 2 tablet 0   DM-Doxylamine-Acetaminophen (NYQUIL COLD & FLU PO) Take by mouth as needed. (Patient not taking: Reported on 05/21/2024)     Simethicone  125 MG CAPS Take by mouth as needed. (Patient not taking: Reported on 05/21/2024)     No facility-administered medications prior to visit.     EXAM:  BP 138/80 (BP Location: Right Arm, Patient Position: Sitting, Cuff Size: Normal)   Pulse 70   Temp 97.7 F (36.5 C) (Oral)   Ht 5' 3 (1.6 m)   Wt 110 lb 6.4 oz (50.1 kg)   SpO2 97%   BMI 19.56 kg/m   Body mass index is 19.56 kg/m.  GENERAL: vitals reviewed and listed above, alert, oriented, appears well hydrated and in no acute distress HEENT: atraumatic, conjunctiva  clear, no obvious abnormalities on inspection of external nose and ears  MS: moves all extremities without noticeable focal  abnormality PSYCH: pleasant and cooperative, no obvious depression or anxiety Lab Results  Component Value Date   WBC 5.7 01/24/2024   HGB 12.4 01/24/2024   HCT 37.4 01/24/2024   PLT 274.0 01/24/2024   GLUCOSE 111 (H) 01/24/2024   CHOL 231 (H) 01/24/2024   TRIG 145.0 01/24/2024   HDL 72.70 01/24/2024   LDLDIRECT 109.9 06/11/2013   LDLCALC 129 (H) 01/24/2024   ALT 10 01/24/2024   AST 16 01/24/2024   NA 139 01/24/2024   K 4.0 01/24/2024   CL 102 01/24/2024   CREATININE 0.68 01/24/2024   BUN 21 01/24/2024   CO2 30 01/24/2024   TSH 1.17 03/20/2024   HGBA1C 5.6 04/19/2023   BP Readings from Last 3 Encounters:  05/21/24 138/80  04/09/24 (!) 152/76  03/20/24 (!) 149/78    ASSESSMENT AND PLAN:  Discussed the following assessment and plan:  Adjustment reaction with anxiety and depression  Caregiver stress  Complaints of memory disturbance  Elevated blood pressure reading  Medication management Appears  mood interfering.   Caregiver stress and  frustration . Thinks  Wellbutrin  did help in the past    just began the higher dosing of  buspar   15 bid  Will continue for another 2-3 week  and if not helping enough will made a plan to switch to Wellbutrin  . She feels it helped  in past and stopped  cause  didn't need anymore ) Contacts in 2-3 weeks ( I will be out of office week of July 20)  to decide  on meds  Keep appt with neuro psych . Agree with caregivers group and support would be helpful  and consider individual counseling  as external factors are contributing .  -Patient advised to return or notify health care team  if  new concerns arise.  Patient Instructions  Stay on the buspirone  at 1 twice a day for another 1-2 weeks to see if helpful . I agree with caretakers  group.  May be helpful over time.   If not   help with buspar  after another 2-3 weeks  we can change to the Wellbutrin  and ramp up.   BP was 138/80 on second trial.    Vernadette Stutsman K. Renay Crammer M.D.

## 2024-05-22 ENCOUNTER — Ambulatory Visit: Admitting: Psychology

## 2024-05-22 DIAGNOSIS — R4189 Other symptoms and signs involving cognitive functions and awareness: Secondary | ICD-10-CM | POA: Diagnosis not present

## 2024-05-22 DIAGNOSIS — F419 Anxiety disorder, unspecified: Secondary | ICD-10-CM

## 2024-05-22 DIAGNOSIS — F330 Major depressive disorder, recurrent, mild: Secondary | ICD-10-CM | POA: Diagnosis not present

## 2024-05-22 NOTE — Progress Notes (Signed)
   Neuropsychology Feedback Session Sheri Jensen. [facility] Department of Neurology  Reason for Referral:   Sheri Jensen is a 78 y.o. left-handed Caucasian female referred by Camie Sevin, PA-C, to characterize her current cognitive functioning and assist with diagnostic clarity and treatment planning in the context of subjective cognitive decline.   Feedback:   Sheri Jensen completed a comprehensive neuropsychological evaluation on 05/09/2024. Please refer to that encounter for the full report and recommendations. Briefly, results suggested neuropsychological functioning largely within normal limits relative to age-matched peers. An isolated relative weakness was exhibited across verbal fluency (phonemic more than semantic). Outside of this, all other assessed domains were appropriate relative to age-matched peers. This includes processing speed, attention/concentration, executive functioning, receptive language, confrontation naming, visuospatial abilities, and all aspects of learning and memory. During interview, Sheri Jensen repeatedly highlighted ongoing caregiver stress surrounding her husband's medical history. She additionally described ongoing mood symptoms, predominantly surrounding depressive experiences. Past neuroimaging also suggested mild microvascular ischemic disease and remote microhemorrhages within the bilateral frontal lobes and left cerebellar hemisphere of unknown cause. Overall, the combination of these variables superimposed on the normal aging process would appear the most likely culprit for reported subjective memory/cognitive decline. Specific to memory, Sheri Jensen was able to learn novel verbal and visual information efficiently and retain this knowledge after lengthy delays. Her examples of day-to-day memory dysfunction provided during interview would appear more likely to be attention-based rather than consistent with a rapid forgetting profile. Overall, memory  performance combined with intact performances across other areas of cognitive functioning is not suggestive of presently symptomatic Alzheimer's disease.   Sheri Jensen was unaccompanied during the current feedback session. Content of the current session focused on the results of her neuropsychological evaluation. Sheri Jensen was given the opportunity to ask questions and her questions were answered. She was encouraged to reach out should additional questions arise. A copy of her report was provided at the conclusion of the visit.      One unit 96132 (31 minutes) was billed for Dr. Loralee time spent preparing for, conducting, and documenting the current feedback session with Sheri Jensen.

## 2024-05-30 DIAGNOSIS — H5213 Myopia, bilateral: Secondary | ICD-10-CM | POA: Diagnosis not present

## 2024-06-03 ENCOUNTER — Other Ambulatory Visit: Payer: Self-pay | Admitting: Internal Medicine

## 2024-06-04 ENCOUNTER — Other Ambulatory Visit: Payer: Self-pay | Admitting: Internal Medicine

## 2024-06-10 ENCOUNTER — Encounter: Payer: Self-pay | Admitting: Internal Medicine

## 2024-06-11 ENCOUNTER — Ambulatory Visit: Admitting: Physician Assistant

## 2024-06-13 NOTE — Telephone Encounter (Signed)
 See  reply on other thread about trying 15 mg 2 x per day of buspirone  before changing meds

## 2024-06-13 NOTE — Telephone Encounter (Signed)
 So lets go to a  higher dose  of buspirone   before  deciding  we need  to change..  increase to 15 mg 2 x per day ( 30 mg per day)  Please send in  refill 15 mg bid  disp 60 refill x1  After 3-4 weeks  need fu status about how doing  and then go from there.

## 2024-06-16 ENCOUNTER — Encounter: Payer: Self-pay | Admitting: Internal Medicine

## 2024-06-18 ENCOUNTER — Other Ambulatory Visit: Payer: Self-pay

## 2024-06-18 MED ORDER — BUSPIRONE HCL 15 MG PO TABS: 15.0000 mg | ORAL_TABLET | Freq: Two times a day (BID) | ORAL | 1 refills | Status: DC

## 2024-06-18 NOTE — Telephone Encounter (Signed)
 Rx sent. Mychart message was sent to pt.

## 2024-06-28 ENCOUNTER — Ambulatory Visit: Admitting: Physician Assistant

## 2024-07-10 DIAGNOSIS — K080 Exfoliation of teeth due to systemic causes: Secondary | ICD-10-CM | POA: Diagnosis not present

## 2024-08-18 ENCOUNTER — Other Ambulatory Visit: Payer: Self-pay | Admitting: Internal Medicine

## 2024-10-18 ENCOUNTER — Other Ambulatory Visit: Payer: Self-pay | Admitting: Internal Medicine

## 2024-12-01 ENCOUNTER — Other Ambulatory Visit: Payer: Self-pay | Admitting: Internal Medicine
